# Patient Record
Sex: Female | Born: 1975 | Race: White | Hispanic: No | Marital: Married | Smoking: Former smoker
Health system: Southern US, Community
[De-identification: ages and names within clinical notes are randomized; demographics above are authoritative.]

## PROBLEM LIST (undated history)

## (undated) DIAGNOSIS — I10 Essential (primary) hypertension: Secondary | ICD-10-CM

## (undated) DIAGNOSIS — E7800 Pure hypercholesterolemia, unspecified: Secondary | ICD-10-CM

## (undated) DIAGNOSIS — K859 Acute pancreatitis without necrosis or infection, unspecified: Secondary | ICD-10-CM

## (undated) DIAGNOSIS — F419 Anxiety disorder, unspecified: Secondary | ICD-10-CM

## (undated) DIAGNOSIS — E119 Type 2 diabetes mellitus without complications: Secondary | ICD-10-CM

## (undated) DIAGNOSIS — K219 Gastro-esophageal reflux disease without esophagitis: Secondary | ICD-10-CM

## (undated) HISTORY — DX: Anxiety disorder, unspecified: F41.9

## (undated) HISTORY — DX: Acute pancreatitis without necrosis or infection, unspecified: K85.90

## (undated) HISTORY — DX: Gastro-esophageal reflux disease without esophagitis: K21.9

## (undated) HISTORY — PX: CHOLECYSTECTOMY: SHX55

## (undated) HISTORY — DX: Type 2 diabetes mellitus without complications: E11.9

## (undated) HISTORY — DX: Essential (primary) hypertension: I10

---

## 2001-01-23 ENCOUNTER — Inpatient Hospital Stay (HOSPITAL_COMMUNITY): Admission: AD | Admit: 2001-01-23 | Discharge: 2001-01-25 | Payer: Self-pay | Admitting: Obstetrics and Gynecology

## 2009-05-31 ENCOUNTER — Observation Stay (HOSPITAL_COMMUNITY): Admission: RE | Admit: 2009-05-31 | Discharge: 2009-05-31 | Payer: Self-pay | Admitting: Obstetrics & Gynecology

## 2009-06-11 ENCOUNTER — Inpatient Hospital Stay (HOSPITAL_COMMUNITY): Admission: AD | Admit: 2009-06-11 | Discharge: 2009-06-13 | Payer: Self-pay | Admitting: Obstetrics and Gynecology

## 2010-08-18 ENCOUNTER — Inpatient Hospital Stay (HOSPITAL_COMMUNITY): Admission: AD | Admit: 2010-08-18 | Discharge: 2010-08-18 | Payer: Self-pay | Admitting: Obstetrics and Gynecology

## 2010-08-20 ENCOUNTER — Inpatient Hospital Stay (HOSPITAL_COMMUNITY): Admission: AD | Admit: 2010-08-20 | Discharge: 2010-08-23 | Payer: Self-pay | Admitting: Obstetrics and Gynecology

## 2010-12-16 ENCOUNTER — Encounter: Payer: Self-pay | Admitting: Obstetrics and Gynecology

## 2011-02-04 ENCOUNTER — Observation Stay (HOSPITAL_COMMUNITY)
Admission: EM | Admit: 2011-02-04 | Discharge: 2011-02-04 | Disposition: A | Discharge status: Home / Self Care | Payer: Self-pay | Attending: Emergency Medicine | Admitting: Emergency Medicine

## 2011-02-04 DIAGNOSIS — L519 Erythema multiforme, unspecified: Principal | ICD-10-CM | POA: Insufficient documentation

## 2011-02-04 LAB — RPR: RPR Ser Ql: NONREACTIVE

## 2011-02-04 LAB — CBC
HCT: 43.2 % (ref 36.0–46.0)
MCH: 29.2 pg (ref 26.0–34.0)
MCHC: 35 g/dL (ref 30.0–36.0)
MCV: 83.4 fL (ref 78.0–100.0)
RBC: 5.18 MIL/uL — ABNORMAL HIGH (ref 3.87–5.11)

## 2011-02-04 LAB — DIFFERENTIAL
Basophils Absolute: 0 10*3/uL (ref 0.0–0.1)
Lymphocytes Relative: 11 % — ABNORMAL LOW (ref 12–46)
Neutrophils Relative %: 80 % — ABNORMAL HIGH (ref 43–77)

## 2011-02-04 LAB — BASIC METABOLIC PANEL
BUN: 8 mg/dL (ref 6–23)
CO2: 22 mEq/L (ref 19–32)
Creatinine, Ser: 0.7 mg/dL (ref 0.4–1.2)
GFR calc Af Amer: >60 mL/min (ref >60)
Sodium: 139 mEq/L (ref 135–145)

## 2011-02-05 LAB — POCT I-STAT, CHEM 8
Creatinine, Ser: 0.7 mg/dL (ref 0.4–1.2)
Glucose, Bld: 124 mg/dL — ABNORMAL HIGH (ref 70–99)
Potassium: 7.6 mEq/L (ref 3.5–5.1)
TCO2: 18 mmol/L (ref 0–100)

## 2011-02-07 LAB — CBC
HCT: 34.9 % — ABNORMAL LOW (ref 36.0–46.0)
Hemoglobin: 12.5 g/dL (ref 12.0–15.0)
MCH: 29.6 pg (ref 26.0–34.0)
MCHC: 34.4 g/dL (ref 30.0–36.0)
MCV: 87.3 fL (ref 78.0–100.0)
Platelets: 234 10*3/uL (ref 150–400)
RBC: 4 MIL/uL (ref 3.87–5.11)
RDW: 14.4 % (ref 11.5–15.5)
RDW: 14.5 % (ref 11.5–15.5)
WBC: 12.5 10*3/uL — ABNORMAL HIGH (ref 4.0–10.5)

## 2011-02-07 LAB — COMPREHENSIVE METABOLIC PANEL
ALT: 11 U/L (ref 0–35)
AST: 12 U/L (ref 0–37)
Albumin: 2.6 g/dL — ABNORMAL LOW (ref 3.5–5.2)
CO2: 20 mEq/L (ref 19–32)
Calcium: 9.4 mg/dL (ref 8.4–10.5)
Creatinine, Ser: 0.6 mg/dL (ref 0.4–1.2)
GFR calc Af Amer: >60 mL/min (ref >60)
GFR calc non Af Amer: >60 mL/min (ref >60)
Sodium: 135 mEq/L (ref 135–145)
Total Protein: 5.3 g/dL — ABNORMAL LOW (ref 6.0–8.3)

## 2011-02-07 LAB — URIC ACID: Uric Acid, Serum: 4.3 mg/dL (ref 2.4–7.0)

## 2011-03-03 LAB — CBC
HCT: 31.8 % — ABNORMAL LOW (ref 36.0–46.0)
HCT: 35.9 % — ABNORMAL LOW (ref 36.0–46.0)
Hemoglobin: 12.3 g/dL (ref 12.0–15.0)
MCHC: 32.9 g/dL (ref 30.0–36.0)
MCHC: 34.2 g/dL (ref 30.0–36.0)
MCV: 87.4 fL (ref 78.0–100.0)
Platelets: 186 10*3/uL (ref 150–400)
RBC: 3.64 MIL/uL — ABNORMAL LOW (ref 3.87–5.11)
RBC: 4.23 MIL/uL (ref 3.87–5.11)
WBC: 10 10*3/uL (ref 4.0–10.5)

## 2011-04-09 NOTE — H&P (Signed)
Amy, Osborne             ACCOUNT NO.:  [id number]   MEDICAL RECORD NO.:  [id number]          PATIENT TYPE:  INP   LOCATION:  9142                          FACILITY:  [facility]   PHYSICIAN:  Kendra H. Tenny Craw, MD     DATE OF BIRTH:  1976-03-06   DATE OF ADMISSION:  06/11/2009  DATE OF DISCHARGE:                              HISTORY & PHYSICAL   CHIEF COMPLAINT:  Contractions.   HISTORY OF PRESENT ILLNESS:  [name] is a 35 year old gravida 3,  para 2-0-0-2 presents at 40 weeks and 6 days estimated gestational age  complaining of contractions and was found to be in labor.  At maternity  admission, she was 3 cm dilated, 80% effaced and minus 2 station.  She  has had an uncomplicated prenatal course up to this point and ultrasound  for estimated fetal weight was performed on the Thursday prior to  admission which demonstrate an estimated fetal weight of approximately 7  pounds.   PAST MEDICAL HISTORY:  Obesity with a BMI of 42.   PAST SURGICAL HISTORY:  None.   PAST OBSTETRICAL HISTORY:  Gravida 3, para 2-0-0-2.  In 2000, she had a  spontaneous vaginal delivery of a 8 pounds baby at 38 weeks.  In 2002  she had a spontaneous vaginal delivery at [redacted] weeks gestational age of a  female infant weighing 7 pounds 12 ounces.   PAST GYN HISTORY:  No abnormal Pap smears.  No sexually transmitted  diseases.   SOCIAL HISTORY:  Negative x3.   FAMILY HISTORY:  Noncontributory.   PRENATAL LABS:  Blood type A positive, Rh antibody screen negative, RPR  nonreactive, rubella titer immune, hepatitis B surface antigen negative,  HIV nonreactive.  Pap smear within normal limits.  Gonorrhea and  Chlamydia were negative.  The patient declined genetic screening tests.  One-hour Glucola 88.  Group B strep positive.   PHYSICAL EXAM:  AFVSS  GENERAL: AO x 3, NAD  ABDOMEN: Gravid, obese, soft, non-tender  CERVIX: 3/80/-2  TOCO: Q 2-3 minutes  FHT: 140s reactive   ASSESSMENT AND PLAN:  This is  a 35 year old G3 P2 admitted for labor,  expectant management, penicillin for group B strep prophylaxis and  epidural on request.      [name]. Tenny Craw, MD  Electronically Signed     KHR/MEDQ  D:  06/11/2009  T:  06/11/2009  Job:  [id number]

## 2011-04-12 NOTE — Discharge Summary (Signed)
[facility]  Patient:    ACIRE, TANG                         MRN: [id number] Adm. Date:  [id number] Disc. Date: [id number] Attending:  Osborn Coho                           Discharge Summary  DISCHARGE DIAGNOSES:          1. Term pregnancy delivered 7 pound 8 ounce                                  female infant Apgars 8 and 9.                               2. Blood type A+.                               3. Scheduled induction.  PROCEDURE:                    Vacuum extraction assisted delivery.  SUMMARY:                      This 35 year old gravida 2, para 1 at 40 weeks and 5 days was scheduled for induction.  At the time of admission her cervix was 1 cm dilated, very thick, and vertex was very high.  Amniotomy could not be carried out at the time of admission.  Pitocin was begun and as the patient progressed in labor and her cervix began to open and the vertex began to ascend amniotomy was possible, was carried out with production of clear fluid and an intrauterine pressure catheter was placed.  Pitocin was continued.  The patient progressed to full dilatation and was pushing.  Had some significant fetal bradycardia.  At the time of being called to the room to evaluate this the patient was fully dilated with the vertex at a -1/0 station and the fetal heart rate was in the 90s.  Oxygen and position change brought relief to the fetal heart rate, raising it to the level of 110s.  With encouraged pushing the patient descended the vertex to +2/+3 station where it stalled.  With the aid of the vacuum extractor a vaginal delivery was assisted.  With one contraction the vertex became descended to crowning.  The vacuum extractor popped off at this time and the vertex rotated and the patient had the delivery of a viable 7 pound 8 ounce female infant with Apgars 8 and 9 over intact perineum.  There was no nuchal cord, no maternal tears, and  essentially no real explanation of the fetal bradycardia.  The baby did have a small caput from the vacuum extractor which was noted to be appropriately applied.  Both mother and baby tolerated the delivery process well.                                Mother was bottle feeding.  On the morning of March 2 she was feeling well and was desired for discharge.  Discharge hemoglobin was 11.1 with a white count of 12,200 and a platelet count  of 215,000.  On the morning of March ______ she was desired for discharge and was accordingly was given all discharge instructions.  She was ambulating well, tolerating a regular diet well, having normal bowel and bladder function, and was afebrile.  DISCHARGE MEDICATIONS:        1. Tylox one to two q.4-6h.                               2. Motrin 600 mg q.6h. for less discomfort.                               3. Vitamins one q.d.  FOLLOW-UP:                    She will return to the office in approximately four weeks time or as needed.  CONDITION ON DISCHARGE:       Improved. DD:  02/18/01 TD:  02/18/01 Job: [id number]

## 2016-01-17 ENCOUNTER — Telehealth: Payer: Self-pay | Admitting: Family

## 2016-01-17 ENCOUNTER — Other Ambulatory Visit: Payer: Self-pay | Admitting: Family

## 2016-01-17 DIAGNOSIS — N3000 Acute cystitis without hematuria: Secondary | ICD-10-CM

## 2016-01-17 MED ORDER — SULFAMETHOXAZOLE-TRIMETHOPRIM 800-160 MG PO TABS: 1.0000 | ORAL_TABLET | Freq: Two times a day (BID) | ORAL | Status: DC

## 2016-01-17 NOTE — Progress Notes (Signed)

## 2016-01-17 NOTE — Addendum Note (Signed)
Addended by: Jannifer Rodney A on: 01/17/2016 07:16 PM   Modules accepted: Orders

## 2016-04-12 ENCOUNTER — Ambulatory Visit (INDEPENDENT_AMBULATORY_CARE_PROVIDER_SITE_OTHER): Payer: 59 | Admitting: Internal Medicine

## 2016-04-12 ENCOUNTER — Encounter: Payer: Self-pay | Admitting: Internal Medicine

## 2016-04-12 ENCOUNTER — Telehealth: Payer: Self-pay | Admitting: Internal Medicine

## 2016-04-12 VITALS — BP 138/92 | HR 69 | Temp 98.0°F | Ht 66.0 in | Wt 285.1 lb

## 2016-04-12 DIAGNOSIS — Z7189 Other specified counseling: Secondary | ICD-10-CM | POA: Diagnosis not present

## 2016-04-12 DIAGNOSIS — I10 Essential (primary) hypertension: Secondary | ICD-10-CM

## 2016-04-12 DIAGNOSIS — E669 Obesity, unspecified: Secondary | ICD-10-CM

## 2016-04-12 DIAGNOSIS — Z7689 Persons encountering health services in other specified circumstances: Secondary | ICD-10-CM

## 2016-04-12 DIAGNOSIS — Z114 Encounter for screening for human immunodeficiency virus [HIV]: Secondary | ICD-10-CM

## 2016-04-12 DIAGNOSIS — R002 Palpitations: Secondary | ICD-10-CM

## 2016-04-12 DIAGNOSIS — Z0000 Encounter for general adult medical examination without abnormal findings: Secondary | ICD-10-CM

## 2016-04-12 DIAGNOSIS — Z1159 Encounter for screening for other viral diseases: Secondary | ICD-10-CM | POA: Diagnosis not present

## 2016-04-12 DIAGNOSIS — F411 Generalized anxiety disorder: Secondary | ICD-10-CM

## 2016-04-12 LAB — POCT URINALYSIS DIPSTICK
BILIRUBIN UA: NEGATIVE
GLUCOSE UA: NEGATIVE
KETONES UA: NEGATIVE
LEUKOCYTES UA: NEGATIVE
NITRITE UA: NEGATIVE
PH UA: 7
Protein, UA: NEGATIVE
Spec Grav, UA: 1.02
UROBILINOGEN UA: 0.2

## 2016-04-12 LAB — BASIC METABOLIC PANEL WITH GFR
BUN: 13 mg/dL (ref 7–25)
CALCIUM: 9.1 mg/dL (ref 8.6–10.2)
CHLORIDE: 106 mmol/L (ref 98–110)
CO2: 23 mmol/L (ref 20–31)
Creat: 0.76 mg/dL (ref 0.50–1.10)
GFR, Est African American: >89 mL/min (ref >=60)
GLUCOSE: 100 mg/dL — AB (ref 65–99)
Potassium: 4.3 mmol/L (ref 3.5–5.3)
SODIUM: 139 mmol/L (ref 135–146)

## 2016-04-12 LAB — LIPID PANEL
CHOL/HDL RATIO: 4.3 ratio (ref <=5.0)
CHOLESTEROL: 160 mg/dL (ref 125–200)
HDL: 37 mg/dL — AB (ref >=46)
LDL Cholesterol: 87 mg/dL (ref <130)
Triglycerides: 180 mg/dL — ABNORMAL HIGH (ref <150)
VLDL: 36 mg/dL — ABNORMAL HIGH (ref <30)

## 2016-04-12 LAB — CBC
HEMATOCRIT: 43.3 % (ref 35.0–45.0)
HEMOGLOBIN: 14.1 g/dL (ref 11.7–15.5)
MCH: 27.5 pg (ref 27.0–33.0)
MCHC: 32.6 g/dL (ref 32.0–36.0)
MCV: 84.6 fL (ref 80.0–100.0)
MPV: 10.5 fL (ref 7.5–12.5)
Platelets: 336 10*3/uL (ref 140–400)
RBC: 5.12 MIL/uL — AB (ref 3.80–5.10)
RDW: 14 % (ref 11.0–15.0)
WBC: 8.8 10*3/uL (ref 3.8–10.8)

## 2016-04-12 LAB — POCT UA - MICROSCOPIC ONLY

## 2016-04-12 LAB — HIV ANTIBODY (ROUTINE TESTING W REFLEX): HIV: NONREACTIVE

## 2016-04-12 LAB — TSH: TSH: 1.43 m[IU]/L

## 2016-04-12 LAB — POCT GLYCOSYLATED HEMOGLOBIN (HGB A1C): Hemoglobin A1C: 5.9

## 2016-04-12 MED ORDER — LISINOPRIL 10 MG PO TABS: 10.0000 mg | ORAL_TABLET | Freq: Every day | ORAL | Status: DC

## 2016-04-12 MED FILL — LISINOPRIL 10 MG TABLET: 10 | 30 days supply | Qty: 30 | Fill #0

## 2016-04-12 NOTE — Patient Instructions (Addendum)
Ms. Mal AmabileBrock,  It was nice to meet you today.   I have prescribed lisinopril to help with high blood pressure. If you develop a cough, stop the medication and let us know. You would also want to stop this medication if you become pregnant, as it can affect kidney development.  Please come back to clinic in 1-2 weeks to follow-up blood pressure.   I will call with lab results, which also will be available on My Chart.  Best, Dr. Sampson GoonFitzgerald

## 2016-04-12 NOTE — Progress Notes (Signed)
Subjective:    Patient ID: Amy Osborne, female    DOB: 1976-01-24, 40 y.o.   MRN: [id number]  HPI  Amy Osborne is a 40-y/o female who presents to establish care. Goals include managing anxiety and weight.   PMH: - Was uninsured for the past 5-6 years and has had limited medical care over that period of time.  - Had 2 visits to urgent care over the last several months where she reports having systolic blood pressure readings of 180 and 165. - Feels that she has struggled with anxiety and depression in her adult years - Has "random" sensation of palpitations. Could not say how often she experiences this but says it's "more than once in a while" and happens at rest. Also has had hair shedding recently. Has decreased her caffeine intake to virtually none and still occurs.  - Notes chronic aching in her hand and feet that is worse with rainy and cold weather. Describes as a "deep bone pain."  Medications: - Takes ibuprofen 600 mg up to 3 times daily for aches and pains  Allergies: - No known allergies  Surgical History: - Gall bladder removed > 7 years ago  Family History: - Mother: rheumatoid arthritis, anxiety/depression (takes lexapro) - Father: melanoma, T2DM, HLD, HTN - Sister: anxiety/depression, nodules of thyroid (being worked up) - Grandparents: Alzheimer's (m. Actor); heart disease, T2DM, HLD, HTN (p. Grandmother), stroke (m. Surveyor, minerals)  Social History: - Recently began working as a Nurse, learning disability at [employer]. - Has four daughters (17, 45, 6 and 5), the youngest of whom is autistic - Remarried to husband Amy Osborne (44) and had a difficult prior divorce - Used to smoke cigarettes for about 10 years and quit in 2005 - Does not exercise regularly - Does not drink alcohol or use recreational drugs  Health Care Maintenance: - Reports she had a tetanus shot around 2012 and pap smear around 2010.   Review of Systems  Constitutional: Negative for fever and chills.    HENT: Negative for congestion and rhinorrhea.   Eyes: Negative for pain and visual disturbance.  Respiratory: Negative for cough and chest tightness.   Cardiovascular: Positive for palpitations. Negative for chest pain.  Gastrointestinal: Negative for nausea and constipation.  Endocrine: Negative for polydipsia and polyuria.  Genitourinary: Negative for frequency and menstrual problem.  Musculoskeletal: Positive for arthralgias. Negative for joint swelling.  Skin: Negative for rash.  Neurological: Negative for dizziness and weakness.  Psychiatric/Behavioral: Positive for sleep disturbance. The patient is nervous/anxious.       Objective: Blood pressure 138/92, pulse 69, temperature 98 F (36.7 C), temperature source Oral, height  (1.676 m), weight 285 lb 1.6 oz (129.321 kg). Initial BP was 155/96.   Physical Exam  Constitutional: She is oriented to person, place, and time.  Very pleasant, obese female, in NAD  HENT:  Head: Normocephalic and atraumatic.  Nose: Nose normal.  Mouth/Throat: Oropharynx is clear and moist.  Eyes: Conjunctivae are normal. Pupils are equal, round, and reactive to light.  Neck: Normal range of motion. Neck supple. No thyromegaly present.  Cardiovascular: Normal rate, regular rhythm and normal heart sounds.   No murmur heard. Pulmonary/Chest: Effort normal and breath sounds normal. No respiratory distress. She has no wheezes.  Abdominal: Soft. Bowel sounds are normal. She exhibits no distension. There is no tenderness. There is no rebound and no guarding.  Musculoskeletal: Normal range of motion. She exhibits no edema.  Lymphadenopathy:    She has no cervical adenopathy.  Neurological: She is alert and oriented to person, place, and time.  Skin: Skin is warm and dry.  Psychiatric:  Mood normal. Somewhat anxious affect.   GAD-7: 14 PHQ-2: 0    Assessment & Plan:  Patient presents to establish care. Goals of care include treating high blood  pressure, working on weight loss, and addressing anxiety.  Patient to follow-up in 1-2 weeks about blood pressure and labs.  Essential hypertension - Prescribed lisinopril 10 mg. - Obtain BMP and UA.  - Decreasing NSAID use as able may also help.   Generalized anxiety disorder - Continue to discuss at future visits. - Will screen for thyroid hormone abnormality with TSH.  Obesity - Obtain lipid panel and hgb A1c.  Health care maintenance - Obtain HIV screen.  - Due for pap smear. Will need to obtain records of TDAP.   Dani GobbleHillary Fitzgerald, MD [facility], PGY-1

## 2016-04-12 NOTE — Telephone Encounter (Signed)
Called patient to inform her of Hgb A1c result that showed pre-diabetes. She is very motivated to make lifestyle changes and is also interested in meeting with a nutritionist. Will help her arrange at follow-up.

## 2016-04-14 ENCOUNTER — Encounter: Payer: Self-pay | Admitting: Internal Medicine

## 2016-04-14 DIAGNOSIS — I10 Essential (primary) hypertension: Secondary | ICD-10-CM | POA: Insufficient documentation

## 2016-04-14 DIAGNOSIS — Z0000 Encounter for general adult medical examination without abnormal findings: Secondary | ICD-10-CM | POA: Insufficient documentation

## 2016-04-14 DIAGNOSIS — E669 Obesity, unspecified: Secondary | ICD-10-CM | POA: Insufficient documentation

## 2016-04-14 DIAGNOSIS — F411 Generalized anxiety disorder: Secondary | ICD-10-CM | POA: Insufficient documentation

## 2016-04-14 NOTE — Assessment & Plan Note (Signed)
-   Continue to discuss at future visits. - Will screen for thyroid hormone abnormality with TSH.

## 2016-04-14 NOTE — Assessment & Plan Note (Signed)
-   Obtain HIV screen.  - Due for pap smear. Will need to obtain records of TDAP.

## 2016-04-14 NOTE — Assessment & Plan Note (Signed)
-   Obtain lipid panel and hgb A1c.

## 2016-04-14 NOTE — Assessment & Plan Note (Addendum)
-   Prescribed lisinopril 10 mg. - Obtain BMP and UA.  - Decreasing NSAID use as able may also help.

## 2016-04-15 ENCOUNTER — Telehealth: Payer: Self-pay | Admitting: Internal Medicine

## 2016-04-15 NOTE — Telephone Encounter (Signed)
Pt called because she was prescribed Lisinopril. When she took her second dose she had severe stomach pains that last until today. He second dose was on Saturday. Please call to discuss with the nurse on what should she do. jw

## 2016-04-16 ENCOUNTER — Telehealth: Payer: Self-pay | Admitting: Family Medicine

## 2016-04-16 ENCOUNTER — Encounter: Payer: Self-pay | Admitting: Family Medicine

## 2016-04-16 ENCOUNTER — Ambulatory Visit (INDEPENDENT_AMBULATORY_CARE_PROVIDER_SITE_OTHER): Payer: 59 | Admitting: Family Medicine

## 2016-04-16 VITALS — BP 160/103 | HR 85 | Temp 97.9°F | Ht 66.0 in | Wt 281.4 lb

## 2016-04-16 DIAGNOSIS — I10 Essential (primary) hypertension: Secondary | ICD-10-CM

## 2016-04-16 DIAGNOSIS — R1012 Left upper quadrant pain: Secondary | ICD-10-CM

## 2016-04-16 DIAGNOSIS — R109 Unspecified abdominal pain: Secondary | ICD-10-CM | POA: Diagnosis not present

## 2016-04-16 DIAGNOSIS — K853 Drug induced acute pancreatitis without necrosis or infection: Secondary | ICD-10-CM | POA: Insufficient documentation

## 2016-04-16 LAB — COMPLETE METABOLIC PANEL WITH GFR
ALBUMIN: 4.2 g/dL (ref 3.6–5.1)
ALK PHOS: 97 U/L (ref 33–115)
ALT: 42 U/L — ABNORMAL HIGH (ref 6–29)
AST: 28 U/L (ref 10–30)
BUN: 11 mg/dL (ref 7–25)
CALCIUM: 9.4 mg/dL (ref 8.6–10.2)
CHLORIDE: 104 mmol/L (ref 98–110)
CO2: 24 mmol/L (ref 20–31)
Creat: 0.68 mg/dL (ref 0.50–1.10)
GFR, Est African American: >89 mL/min (ref >=60)
Glucose, Bld: 113 mg/dL — ABNORMAL HIGH (ref 65–99)
POTASSIUM: 4.3 mmol/L (ref 3.5–5.3)
Sodium: 137 mmol/L (ref 135–146)
Total Bilirubin: 0.6 mg/dL (ref 0.2–1.2)
Total Protein: 6.5 g/dL (ref 6.1–8.1)

## 2016-04-16 LAB — CBC WITH DIFFERENTIAL/PLATELET
BASOS PCT: 0 %
Basophils Absolute: 0 cells/uL (ref 0–200)
EOS PCT: 2 %
Eosinophils Absolute: 158 cells/uL (ref 15–500)
HCT: 43.5 % (ref 35.0–45.0)
HEMOGLOBIN: 14.4 g/dL (ref 11.7–15.5)
LYMPHS ABS: 2133 {cells}/uL (ref 850–3900)
Lymphocytes Relative: 27 %
MCH: 28 pg (ref 27.0–33.0)
MCHC: 33.1 g/dL (ref 32.0–36.0)
MCV: 84.6 fL (ref 80.0–100.0)
MONOS PCT: 6 %
MPV: 10.5 fL (ref 7.5–12.5)
Monocytes Absolute: 474 cells/uL (ref 200–950)
NEUTROS ABS: 5135 {cells}/uL (ref 1500–7800)
Neutrophils Relative %: 65 %
Platelets: 306 10*3/uL (ref 140–400)
RBC: 5.14 MIL/uL — AB (ref 3.80–5.10)
RDW: 13.8 % (ref 11.0–15.0)
WBC: 7.9 10*3/uL (ref 3.8–10.8)

## 2016-04-16 LAB — LIPASE: LIPASE: 640 U/L — AB (ref 7–60)

## 2016-04-16 LAB — POCT UA - MICROSCOPIC ONLY

## 2016-04-16 LAB — POCT URINALYSIS DIPSTICK
BILIRUBIN UA: NEGATIVE
GLUCOSE UA: NEGATIVE
Ketones, UA: NEGATIVE
NITRITE UA: NEGATIVE
PH UA: 7
Protein, UA: NEGATIVE
RBC UA: NEGATIVE
Spec Grav, UA: 1.015
UROBILINOGEN UA: 0.2

## 2016-04-16 LAB — POCT URINE PREGNANCY: Preg Test, Ur: NEGATIVE

## 2016-04-16 MED ORDER — PANTOPRAZOLE SODIUM 40 MG PO TBEC: 40.0000 mg | DELAYED_RELEASE_TABLET | Freq: Every day | ORAL | Status: DC

## 2016-04-16 MED ORDER — RANITIDINE HCL 150 MG PO TABS: 150.0000 mg | ORAL_TABLET | Freq: Two times a day (BID) | ORAL | Status: DC

## 2016-04-16 MED FILL — PANTOPRAZOLE SOD DR 40 MG T: 40 | 30 days supply | Qty: 30 | Fill #0

## 2016-04-16 MED FILL — raNITIdine HCL 150 MG TABS: 150 | 30 days supply | Qty: 60 | Fill #0

## 2016-04-16 NOTE — Assessment & Plan Note (Signed)
Blood pressure up today, though not on lisinopril at present due to LUQ pain. Advised it is ok if she holds off on taking lisinopril for now. We will see her back in 1 week and address blood pressure at that time.

## 2016-04-16 NOTE — Assessment & Plan Note (Addendum)
Acute in onset four days ago. Differential diagnosis includes ulcer, gastritis, nephrolithiasis, pancreatitis, among other potential etiologies. Patient is systemically well appearing, afebrile, and has an overall benign abdominal exam today (just focal tenderness in LUQ). Leading etiology at this time is gastritis with possible peptic ulcer disease given known frequent ingestion of moderately high doses of NSAIDs (600mg  three times daily). Urinalysis without blood or other major signs of infection so doubt pyelonephritis or nephrolithiasis at this time. Urine pregnancy test negative. Splenic pathology less likely as no spleen palpable. No red flags (no fever, blood in stool, vomiting, etc). Doubt this is related to lisinopril, suspect timing is coincidental. Discussed options for evaluation & treatment with patient, including labs, imaging, medication trial. Patient would prefer to try less expensive workup/treatment first. After discussion, plan we settled on is: - labs today: CMET, CBC with diff, lipase, urea breath test for H pylori - scheduled zantac and protonix - follow up in 1 week to eval for improvement, sooner if not improving or worsening - if worsens or not improving would consider CT abdomen & GI referral for likely EGD - back off ibuprofen - patient agreeable to this plan

## 2016-04-16 NOTE — Progress Notes (Signed)
Date of Visit: 04/16/2016   HPI:  Patient presents for a same day appointment to discuss pain in LUQ.  Patient was just seen here last week to establish care with Dr. Sampson Goon. She was started on lisinopril  daily for hypertension. Took the first pill on Friday. Saturday took the second pill, and within a few hours began having pain in LUQ radiating around to her back. The pain steadily increased. Was restless all night on Saturday, couldn't find a comfortable position. Took her husband's tizanidine to be able to sleep. Pain has persisted ever since then. Intensity comes and goes but it is always present. Highest pain scale is 8, right now it is 4-5. Patient reports high pain tolerance, has had unmedicated vaginal deliveries in the past.  Denies vomiting but has had some mild nausea. No diarrhea but had a little bit of loose stool.  Does not think she's had a temperature though husband told her she felt warm the other night. No dysuria or blood in urine or stool. Has decreased appetite but is drinking normally. Unknown LMP as she has a mirena IUD. No history of pain like this in the past. Has not taken any lisinopril since Saturday. Tried tums, without significant relief.  Of note, has been taking a lot of ibuprofen lately due to pain in her hands. Takes ibuprofen  in the morning, 600 at lunch, and  at evening on the days she goes to work. 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  Prior abdominal surgeries include cholecystectomy.  ROS: See HPI  PMFSH: history of hypertension, GAD, obesity  PHYSICAL EXAM: BP 160/103 mmHg  Pulse 85  Temp(Src) 97.9 F (36.6 C) (Oral)  Ht  (1.676 m)  Wt 281 lb 6.4 oz (127.642 kg)  BMI 45.44 kg/m2 Gen: NAD, pleasant, cooperative, well appearing HEENT: normocephalic, atraumatic, moist mucous membranes  Heart: regular rate and rhythm, no murmur Lungs: clear to auscultation bilaterally, normal work of  breathing  Abdomen: obese. Soft. Tender to palpation in LUQ area. No peritoneal signs or rigidity. No masses or organomegaly Neuro: alert, grossly nonfocal, speech normal Extremities: No appreciable lower extremity edema bilaterally  Skin: no rashes over left abdomen, flank, or back  ASSESSMENT/PLAN:  Abdominal pain, left upper quadrant Acute in onset four days ago. Differential diagnosis includes ulcer, gastritis, nephrolithiasis, pancreatitis, among other potential etiologies. Patient is systemically well appearing, afebrile, and has an overall benign abdominal exam today (just focal tenderness in LUQ). Leading etiology at this time is gastritis with possible peptic ulcer disease given known frequent ingestion of moderately high doses of NSAIDs (  three times daily). Urinalysis without blood or other major signs of infection so doubt pyelonephritis or nephrolithiasis at this time. Urine pregnancy test negative. Splenic pathology less likely as no spleen palpable. No red flags (no fever, blood in stool, vomiting, etc). Doubt this is related to lisinopril, suspect timing is coincidental. Discussed options for evaluation & treatment with patient, including labs, imaging, medication trial. Patient would prefer to try less expensive workup/treatment first. After discussion, plan we settled on is: - labs today: CMET, CBC with diff, lipase, urea breath test for H pylori - scheduled zantac and protonix - follow up in 1 week to eval for improvement, sooner if not improving or worsening - if worsens or not improving would consider CT abdomen & GI referral for likely EGD - back off ibuprofen - patient agreeable to this plan  Essential hypertension  Blood pressure up today, though not on lisinopril at present due to LUQ pain. Advised it is ok if she holds off on taking lisinopril for now. We will see her back in 1 week and address blood pressure at that time.    FOLLOW UP: Follow up in 1 week for LUQ  pain  GrenadaBrittany J. Pollie MeyerMcIntyre, MD [facility]

## 2016-04-16 NOTE — Telephone Encounter (Signed)
Called patient to reiterate to her that she should STOP ibuprofen for now - not sure I told her this adamantly or clearly in her visit earlier.  She reports she took zantac and protonix as soon as she could after the visit earlier today. Has not had any relief. Pain is now at a 6-7. Not eating or drinking much now as she feels worse after doing that. Has tolerated some PO but is overall less than normal. She has been burping a lot this afternoon.  Reviewed cardiac systems with her - denies chest pain or shortness of breath. Over the last few weeks has had very brief episodes of palpitations, lasting 10-15 seconds at a time, then goes away. That is not typical for her.  Advised I will call her tomorrow to let her know what labs show (none are back yet right now). We will go from there to decide on next steps. Advised going to ED tonight if pain worsens or she is not able to tolerate PO at all, has decreased urination, etc.   Patient appreciative.  Latrelle DodrillBrittany J Mckale Haffey, MD

## 2016-04-16 NOTE — Patient Instructions (Signed)
Checking labs today: liver, kidneys, blood counts, pancreas Also checking breath test for H Pylori  Try backing off of the ibuprofen  Start zantac 150mg  twice daily  Also protonix 40mg  daily  Sent both of these in to your pharmacy  Follow up in 1 week, sooner if not getting better If not better we can send to GI or get CT scan  Be well, Dr. Pollie MeyerMcIntyre

## 2016-04-16 NOTE — Telephone Encounter (Signed)
It looks like she was seen for SDA with Dr. Pollie MeyerMcIntyre. Hopefully this issue with the lisinopril was discussed at this appointment. If patient needs further advice please let me know.   Marcy Sirenatherine Wallace, D.O. 04/16/2016, 1:34 PM PGY-1, [facility]

## 2016-04-17 ENCOUNTER — Telehealth: Payer: Self-pay | Admitting: Family Medicine

## 2016-04-17 LAB — H. PYLORI BREATH TEST: H. PYLORI BREATH TEST: NOT DETECTED

## 2016-04-17 MED ORDER — HYDROCODONE-ACETAMINOPHEN 5-325 MG PO TABS: 1.0000 | ORAL_TABLET | Freq: Four times a day (QID) | ORAL | Status: DC | PRN

## 2016-04-17 MED FILL — HYDROCODON-APAP 5-325: 5-325 | 8 days supply | Qty: 30 | Fill #0

## 2016-04-17 NOTE — Telephone Encounter (Signed)
Called patient to discuss labs.  Lipase elevated at 640 - suggestive of acute pancreatitis. This may in fact be due to lisinopril (labeling says approximately 1% risk of pancreatitis with this medication).  Rest of labs relatively unremarkable. Mild ALT elevation at 40, fairly nonspecific. No prior LFTs to compare.  Discussed dx with patient and options for management. Explained this is a common reason for hospital admission but that if her pain is manageable at home and she is able to stay hydrated with liquids, that we can attempt outpatient management. She would prefer to avoid admission if possible. 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  Advised small frequent sips of clear liquids. She will monitor her hydration status by ensuring she continues to urinate normally. Recommend that she can hold off on eating unless she feels hungry - in which case can try bland foods. She has just taken her first sip of liquids of the morning. Has not eaten yet since last night.  Stressed importance of going to ER if any worsening, having a very low threshold for admission should her pain worsen, she is unable to maintain hydration, begins vomiting, has a fever, etc. Also offered that if pain is not manageable she can call us and we can try to arrange direct admission. Will leave rx for Norco at the front desk for her or her husband to pick up.   Follow up appointment scheduled for Friday morning with me at 8:45am. Patient appreciative.  Latrelle DodrillBrittany J Dreyton Roessner, MD

## 2016-04-18 ENCOUNTER — Encounter: Payer: Self-pay | Admitting: Family Medicine

## 2016-04-19 ENCOUNTER — Telehealth: Payer: Self-pay | Admitting: Family Medicine

## 2016-04-19 ENCOUNTER — Ambulatory Visit (HOSPITAL_COMMUNITY)
Admission: RE | Admit: 2016-04-19 | Discharge: 2016-04-19 | Disposition: A | Discharge status: Home / Self Care | Payer: 59 | Source: Ambulatory Visit | Attending: Family Medicine | Admitting: Family Medicine

## 2016-04-19 ENCOUNTER — Encounter: Payer: Self-pay | Admitting: Family Medicine

## 2016-04-19 ENCOUNTER — Ambulatory Visit (INDEPENDENT_AMBULATORY_CARE_PROVIDER_SITE_OTHER): Payer: 59 | Admitting: Family Medicine

## 2016-04-19 VITALS — BP 170/97 | HR 82 | Temp 97.9°F | Wt 275.0 lb

## 2016-04-19 DIAGNOSIS — Z9049 Acquired absence of other specified parts of digestive tract: Secondary | ICD-10-CM | POA: Insufficient documentation

## 2016-04-19 DIAGNOSIS — K760 Fatty (change of) liver, not elsewhere classified: Secondary | ICD-10-CM | POA: Insufficient documentation

## 2016-04-19 DIAGNOSIS — K853 Drug induced acute pancreatitis without necrosis or infection: Secondary | ICD-10-CM | POA: Diagnosis not present

## 2016-04-19 DIAGNOSIS — K858 Other acute pancreatitis without necrosis or infection: Secondary | ICD-10-CM

## 2016-04-19 DIAGNOSIS — K859 Acute pancreatitis without necrosis or infection, unspecified: Secondary | ICD-10-CM | POA: Diagnosis not present

## 2016-04-19 LAB — CBC WITH DIFFERENTIAL/PLATELET
BASOS ABS: 0 10*3/uL (ref 0.0–0.1)
Basophils Relative: 0 %
EOS ABS: 0.2 10*3/uL (ref 0.0–0.7)
EOS PCT: 2 %
HCT: 44.8 % (ref 36.0–46.0)
Hemoglobin: 14.7 g/dL (ref 12.0–15.0)
LYMPHS PCT: 23 %
Lymphs Abs: 2.1 10*3/uL (ref 0.7–4.0)
MCH: 27.7 pg (ref 26.0–34.0)
MCHC: 32.8 g/dL (ref 30.0–36.0)
MCV: 84.4 fL (ref 78.0–100.0)
Monocytes Absolute: 0.5 10*3/uL (ref 0.1–1.0)
Monocytes Relative: 5 %
Neutro Abs: 6.5 10*3/uL (ref 1.7–7.7)
Neutrophils Relative %: 70 %
PLATELETS: 303 10*3/uL (ref 150–400)
RBC: 5.31 MIL/uL — AB (ref 3.87–5.11)
RDW: 13.2 % (ref 11.5–15.5)
WBC: 9.4 10*3/uL (ref 4.0–10.5)

## 2016-04-19 LAB — COMPREHENSIVE METABOLIC PANEL
ALT: 55 U/L — ABNORMAL HIGH (ref 14–54)
AST: 37 U/L (ref 15–41)
Albumin: 4.5 g/dL (ref 3.5–5.0)
Alkaline Phosphatase: 93 U/L (ref 38–126)
Anion gap: 8 (ref 5–15)
BILIRUBIN TOTAL: 1.1 mg/dL (ref 0.3–1.2)
BUN: 7 mg/dL (ref 6–20)
CO2: 22 mmol/L (ref 22–32)
Calcium: 9.8 mg/dL (ref 8.9–10.3)
Chloride: 106 mmol/L (ref 101–111)
Creatinine, Ser: 0.78 mg/dL (ref 0.44–1.00)
Glucose, Bld: 103 mg/dL — ABNORMAL HIGH (ref 65–99)
POTASSIUM: 4.4 mmol/L (ref 3.5–5.1)
Sodium: 136 mmol/L (ref 135–145)
TOTAL PROTEIN: 6.6 g/dL (ref 6.5–8.1)

## 2016-04-19 LAB — LIPASE, BLOOD: LIPASE: 65 U/L — AB (ref 11–51)

## 2016-04-19 MED ORDER — AMLODIPINE BESYLATE 5 MG PO TABS: 5.0000 mg | ORAL_TABLET | Freq: Every day | ORAL | Status: DC

## 2016-04-19 MED ORDER — HYDROMORPHONE HCL 2 MG PO TABS: 2.0000 mg | ORAL_TABLET | ORAL | Status: DC | PRN

## 2016-04-19 MED ORDER — SODIUM CHLORIDE 0.9 % IV SOLN
INTRAVENOUS | Status: DC
  Administered 2016-04-19: 10:00:00 via INTRAVENOUS

## 2016-04-19 MED ORDER — DIATRIZOATE MEGLUMINE & SODIUM 66-10 % PO SOLN
30.0000 mL | Freq: Once | ORAL | Status: AC
  Administered 2016-04-19: 30 mL via ORAL

## 2016-04-19 MED ORDER — IOPAMIDOL (ISOVUE-300) INJECTION 61%
100.0000 mL | Freq: Once | INTRAVENOUS | Status: AC | PRN
  Administered 2016-04-19: 100 mL via INTRAVENOUS

## 2016-04-19 MED FILL — HYDROmorphone HCL 2 MG TABS: 2 | 2 days supply | Qty: 20 | Fill #0

## 2016-04-19 MED FILL — AMLODIPINE BESYLATE 5 MG TA: 5 | 30 days supply | Qty: 30 | Fill #0

## 2016-04-19 NOTE — Assessment & Plan Note (Signed)
Continues to have pain despite relative bowel rest (minimal solids but enteral liquid ingestion). Vitals remain stable (other than elevated blood pressure) and abdominal exam remains without peritoneal signs or masses. She does appear mildly dehydrated on exam, with tacky mucous membranes. She has not had any imaging of her abdomen, so I think it is reasonable to obtain a CT to rule out complications from pancreatitis or other separate pathology contributing to her pain. Also warrants labs to re-evaluate lipase, electrolytes, renal & hepatic function as well as blood counts. Again discussed options for management, including outpatient CT, fluids, and rechecking labs versus ER visit versus direct admission to the hospital. She continues to prefer trial of outpatient management. Plan: - stat labs today: CMET, lipase, CBC with diff, obtained today during clinic - stat CT abdomen/pelvis with contrast, scheduled for early this afternoon - 1L normal saline given IV during clinic today to enhance hydration status - start amlodipine 5mg  daily today to help lower BP and hopefully improve headache - change pain medication to PO dilaudid for better relief - follow up later today by phone after CT and labs have resulted - patient agreeable to this plan & appreciative

## 2016-04-19 NOTE — Progress Notes (Signed)
Date of Visit: 04/19/2016   HPI:  Patient presents for follow up of pancreatitis, diagnosed earlier this week with elevated lipase in the 600s. Presumed to be due to lisinopril, which has been stopped.  Patient reports she is still having the same pain, located in LUQ and radiating directly through to her back. Using norco for pain relief, without significant relief other than it just making her tired. Sipping liquids. Yesterday had leg cramps and drank some powerade zero to replenish electrolytes. No fevers, vomiting. Has had some diarrhea. Feels hungry but pain is worse with eating. No blood in stool. Urinating well, though less than normal. Urine is clear.  Has had also had headache for a few days. No numbness, tingling weakness, speech changes, confusion, vision changes, neck stiffness. Wonders if headache is due to elevated blood pressure. Is not presently on any blood pressure medications since lisinopril was stopped after 2 doses.  ROS: See HPI.  PMFSH: history of GAD, obesity, hypertension   PHYSICAL EXAM: BP 170/97 mmHg  Pulse 82  Temp(Src) 97.9 F (36.6 C) (Oral)  Wt 275 lb (124.739 kg) Gen: NAD, pleasant, cooperative HEENT: normocephalic, atraumatic, mildly dry mucous membranes. Pupils equal round and reactive to light.  Heart: regular rate and rhythm, no murmur Lungs: clear to auscultation bilaterally, normal work of breathing  Neuro: alert, grossly nonfocal, speech normal Abdomen: obese. Soft, no masses. Moderately tender to palpation in epigastric area and LUQ. Normoactive bowel sounds  Ext: atraumatic. Brisk capillary refill Back: no CVA tenderness bilaterally  ASSESSMENT/PLAN:  Drug-induced acute pancreatitis Continues to have pain despite relative bowel rest (minimal solids but enteral liquid ingestion). Vitals remain stable (other than elevated blood pressure) and abdominal exam remains without peritoneal signs or masses. She does appear mildly dehydrated on exam,  with tacky mucous membranes. She has not had any imaging of her abdomen, so I think it is reasonable to obtain a CT to rule out complications from pancreatitis or other separate pathology contributing to her pain. Also warrants labs to re-evaluate lipase, electrolytes, renal & hepatic function as well as blood counts. Again discussed options for management, including outpatient CT, fluids, and rechecking labs versus ER visit versus direct admission to the hospital. She continues to prefer trial of outpatient management. Plan: - stat labs today: CMET, lipase, CBC with diff, obtained today during clinic - stat CT abdomen/pelvis with contrast, scheduled for early this afternoon - 1L normal saline given IV during clinic today to enhance hydration status - start amlodipine 5mg  daily today to help lower BP and hopefully improve headache - change pain medication to PO dilaudid for better relief - follow up later today by phone after CT and labs have resulted - patient agreeable to this plan & appreciative   FOLLOW UP: Follow up by phone after CT scan today  GrenadaBrittany J. Pollie MeyerMcIntyre, MD [facility]

## 2016-04-19 NOTE — Telephone Encounter (Signed)
Attempted to reach patient to discuss CT & lab results No answer. Will try again to reach her in a bit.  Latrelle DodrillBrittany J Emmet Messer, MD

## 2016-04-19 NOTE — Patient Instructions (Signed)
Getting CT scan today Continue to stay hydrated with plenty of fluids  For pain control - oral dilaudid.  For headache & elevated blood pressure - sent in amlodipine 5mg  daily  I will call you when I have your labs and CT scan back.  Call with any questions  If worsening pain, vomiting, fever, etc please call us so we can arrange for admission to the hospital, or go to the ER.  Be well, Dr. Pollie MeyerMcIntyre

## 2016-04-19 NOTE — Telephone Encounter (Signed)
Spoke with patient & relayed results Overall great news - lipase is down, CT without abnormalities She reports headache is improving  Hopefully she will notice improvement in abdominal pain & PO tolerance over the weekend Again reviewed red flags and that she can call at any time with questions & speak with on call resident team Patient appreciative  Note to on call residents - if patient calls in this weekend and is unable to tolerate PO or having uncontrolled pain, as long as she sounds stable I would try to arrange for direct admission rather than having her go through the ED. Feel free to contact me this weekend if I can be of help.  Latrelle DodrillBrittany J Joshue Badal, MD

## 2016-05-21 MED FILL — AMLODIPINE BESYLATE 5 MG TA: 5 | 30 days supply | Qty: 30 | Fill #1

## 2016-05-29 ENCOUNTER — Encounter: Payer: Self-pay | Admitting: Internal Medicine

## 2016-05-29 ENCOUNTER — Ambulatory Visit (INDEPENDENT_AMBULATORY_CARE_PROVIDER_SITE_OTHER): Payer: 59 | Admitting: Internal Medicine

## 2016-05-29 VITALS — BP 149/95 | HR 68 | Temp 98.1°F | Ht 66.0 in | Wt 282.4 lb

## 2016-05-29 DIAGNOSIS — I10 Essential (primary) hypertension: Secondary | ICD-10-CM

## 2016-05-29 DIAGNOSIS — K853 Drug induced acute pancreatitis without necrosis or infection: Secondary | ICD-10-CM

## 2016-05-29 DIAGNOSIS — E669 Obesity, unspecified: Secondary | ICD-10-CM | POA: Diagnosis not present

## 2016-05-29 MED ORDER — AMLODIPINE BESYLATE 10 MG PO TABS: 10.0000 mg | ORAL_TABLET | Freq: Every day | ORAL | Status: DC

## 2016-05-29 NOTE — Patient Instructions (Signed)
[name],  Thank you for coming in today.  You should get a call from surgery within the next week for referral.  If you experience any shortness of breath or increased swelling of hands or feet with increased dose of amlodipine, please stop the medication and call clinic. We can try another medication if so.  Please make an appointment for mirena removal and/or a nurse visit for blood pressure recheck.  Best, Dr. Sampson GoonFitzgerald

## 2016-05-29 NOTE — Progress Notes (Signed)
[facility] Progress Note  Subjective:  Amy Osborne is a 40-yo female who presents for follow-up after pancreatitis. PMH significant for anxiety, HTN, pre-diabetes, and obesity.  Pancreatitis: - Pt had elevated lipase to 640 on 04/16/16, LUQ pain and nausea after initiating BP treatment with lisinopril - She was able to be managed outpatient with improvement in symptoms; IVFs given in clinic in May for concern for dehydration. CT abdomen pelvis 5/26 did not show inflammation of the pancreas; it did show mild diffuse fatty infiltration of the liver - Currently is tolerating a regular diet - Still gets occasional "twinges" of LUQ pain that may last an hour; rates pain at a 3/4 out of 10 ROS: No n/v/d; no fevers/chills  HTN: - Switched to amlodipine 5 mg after developing pancreatitis on lisinopril - Has had improvement in headaches since starting medication for BP - Denies LE swelling but did have 1 episode of feeling like her hands were tight while at work ROS: occasional headaches (once or twice a week), denies vision changes  Obesity: - Patient expressed interest in being referred for gastric bypass surgery - Her husband had the procedure last October and has had great results, per patient - Reports she has tried a range of different diets without success and believes she needs surgery to reduce sensation of hunger  Social: Former smoker  Objective: Blood pressure 159/91, pulse 68, temperature 98.1 F (36.7 C), temperature source Oral, height 5\' 6"  (1.676 m), weight 282 lb 6.4 oz (128.096 kg). Repeat BP 149/95 Constitutional: Pleasant, obese female, in NAD HENT: MMM Cardiovascular: RRR, S1, S2, no m/r/g.  Pulmonary/Chest: Effort normal and breath sounds normal. No respiratory distress.  Abdominal: Soft. +BS, NT, ND, no rebound or guarding.  Psychiatric: Normal mood and affect.  Vitals reviewed  Assessment/Plan: Drug-induced acute pancreatitis - Resolved. Patient  now tolerating regular diet.  - Repeat LFTs at next visit.   Obesity - Patient has BMI>40 and weight-related complications of HTN and pre-diabetes; will refer to general surgery (Dr. Gaynelle AduEric Osborne) for assessment for gastric bypass  Essential hypertension - Not at goal of <140/90 per JNC-8 guidelines - Will increase amlodipine to 10 mg daily; patient counseled to stop medications and call clinic if she develops peripheral extremity swelling - Could consider metoprolol given patient history of migraines   Follow-up within 2 weeks for BP recheck.  [name], MD [facility], PGY-2

## 2016-05-30 ENCOUNTER — Encounter: Payer: Self-pay | Admitting: Internal Medicine

## 2016-05-30 NOTE — Assessment & Plan Note (Signed)
-   Resolved. Patient now tolerating regular diet.  - Repeat LFTs at next visit.

## 2016-05-30 NOTE — Assessment & Plan Note (Signed)
-   Patient has BMI>40 and weight-related complications of HTN and pre-diabetes; will refer to general surgery (Dr. Gaynelle AduEric Wilson) for assessment for gastric bypass

## 2016-05-30 NOTE — Assessment & Plan Note (Signed)
-   Not at goal of <140/90 per JNC-8 guidelines - Will increase amlodipine to 10 mg daily; patient counseled to stop medications and call clinic if she develops peripheral extremity swelling - Could consider metoprolol given patient history of migraines

## 2016-06-12 ENCOUNTER — Encounter: Payer: Self-pay | Admitting: Internal Medicine

## 2016-06-12 DIAGNOSIS — I10 Essential (primary) hypertension: Secondary | ICD-10-CM

## 2016-06-13 MED ORDER — METOPROLOL TARTRATE 50 MG PO TABS: 50.0000 mg | ORAL_TABLET | Freq: Two times a day (BID) | ORAL | Status: DC

## 2016-06-13 MED FILL — METOPROLOL TARTRATE 50 MG T: 50 | 30 days supply | Qty: 60 | Fill #0

## 2016-06-13 NOTE — Telephone Encounter (Signed)
Prescribed metoprolol 50 mg BID, as patient has history of headaches and had extremity swelling with amlodipine. Lisinopril induced pancreatitis.

## 2016-07-17 ENCOUNTER — Telehealth: Payer: Self-pay | Admitting: Internal Medicine

## 2016-07-17 ENCOUNTER — Encounter: Payer: Self-pay | Admitting: Internal Medicine

## 2016-07-17 DIAGNOSIS — L509 Urticaria, unspecified: Secondary | ICD-10-CM

## 2016-07-17 MED ORDER — PREDNISONE 20 MG PO TABS: 20.0000 mg | ORAL_TABLET | Freq: Every day | ORAL | 0 refills | Status: DC

## 2016-07-17 NOTE — Telephone Encounter (Signed)
Spoke with patient over the phone. Had a reaction like this several years ago that only resolved with steroids. Feel it is reasonable to try short course of steroids, as patient is agreeable to follow-up in clinic tomorrow. Has family history of RA and chronic joint pain herself. May benefit from rheumatology referral and/or biopsy of lesion if other causes ruled out.

## 2016-07-17 NOTE — Telephone Encounter (Signed)
Is breaking out in hives. She has an appt tomorrow morning at 8:30 but was hoping to get some prenisone for tonight to get this under control. [facility] on [address] [place]

## 2016-07-18 ENCOUNTER — Ambulatory Visit (INDEPENDENT_AMBULATORY_CARE_PROVIDER_SITE_OTHER): Payer: 59 | Admitting: Student

## 2016-07-18 ENCOUNTER — Encounter: Payer: Self-pay | Admitting: Student

## 2016-07-18 ENCOUNTER — Other Ambulatory Visit: Payer: Self-pay | Admitting: Internal Medicine

## 2016-07-18 DIAGNOSIS — L508 Other urticaria: Secondary | ICD-10-CM | POA: Diagnosis not present

## 2016-07-18 DIAGNOSIS — I10 Essential (primary) hypertension: Secondary | ICD-10-CM

## 2016-07-18 MED ORDER — CETIRIZINE HCL 10 MG PO TABS: 10.0000 mg | ORAL_TABLET | Freq: Every day | ORAL | 11 refills | Status: DC

## 2016-07-18 MED ORDER — RANITIDINE HCL 150 MG PO TABS: 150.0000 mg | ORAL_TABLET | Freq: Two times a day (BID) | ORAL | 0 refills | Status: DC

## 2016-07-18 MED ORDER — METOPROLOL TARTRATE 50 MG PO TABS: 50.0000 mg | ORAL_TABLET | Freq: Two times a day (BID) | ORAL | 0 refills | Status: DC

## 2016-07-18 MED FILL — METOPROLOL TARTRATE 50 MG T: 50 | 30 days supply | Qty: 60 | Fill #0

## 2016-07-18 MED FILL — raNITIdine HCL 150 MG TABS: 150 | 30 days supply | Qty: 60 | Fill #0

## 2016-07-18 MED FILL — ALL DAY ALLERGY 10 MG TAB: 10 | 100 days supply | Qty: 100 | Fill #0

## 2016-07-18 NOTE — Progress Notes (Signed)
   Subjective:    Patient ID: Amy Osborne is a 40 y.o. old female.  HPI #Skin rash: got up from bed about 2:30 pm yesterday with itching all over. She noticed it first behind her ears and on her neck. Then she noted scattered rashes behind her elbows, on her neck, trunk and legs. She describes the rash as redness of the skin.  Denies abnormal sensation in her throat, shortness of breath or chest pain.  Denies new meds, new food, new soap, detergent, recent illness or cold like symptoms. Denies sick contacts. 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   Off note, patient states she had similar presentation in 2012 and went to ED. She was treated with steroid and Benadryl at that time  PMH: reviewed SH: Denies smoking  Review of Systems Per HPI Objective:   Vitals:   07/18/16 0845 07/18/16 0934  BP: (!) 145/76 (!) 136/92  Pulse: 62   Temp: 97.7 F (36.5 C)   TempSrc: Oral   Weight: 284 lb (128.8 kg)   Height: 5\' 6"  (1.676 m)     GEN: appears well, no apparent distress. CVS: RRR, normal s1 and s2 RESP: no increased work of breathing GI: soft, non-tender,non-distended, +BS SKIN: Erythematous skin patch over the back of her neck, elbows bilaterally, and abdomen. Right shin rash is blanchable. See pictures taken by the patient prior to starting prednisone. Her rash has markedly improved today.       NEURO: alert and oriented appropriately, no gross defecits  PSYCH: appropriate mood and affect     Assessment & Plan:  Acute urticaria History and exam findings suggestive for acute urticaria. Patient reports response to prednisone 20 mg daily. So I won't increase his steroid dose today. She is on on 2 of 5 days course of steroid. I have added ranitidine and Zyrtec today. Discussed return precautions including abnormal sensation in her throat, shortness of breath or chest pain or worsening of rash or other symptoms concerning to her as mentioned on AVS.

## 2016-07-18 NOTE — Assessment & Plan Note (Signed)
History and exam findings suggestive for acute urticaria. Patient reports response to prednisone 20 mg daily. So I won't increase his steroid dose today. She is on on 2 of 5 days course of steroid. I have added ranitidine and Zyrtec today. Discussed return precautions including abnormal sensation in her throat, shortness of breath or chest pain or worsening of rash or other symptoms concerning to her as mentioned on AVS.

## 2016-07-18 NOTE — Progress Notes (Deleted)
   Subjective:    Patient ID: Amy Osborne is a 40 y.o. old female.  HPI #  #  PMH: reviewed FMH: *** SH: ***  Review of Systems Per HPI Objective:   Vitals:   07/18/16 0845 07/18/16 0934  BP: (!) 145/76 (!) 136/92  Pulse: 62   Temp: 97.7 F (36.5 C)   TempSrc: Oral   Weight: 284 lb (128.8 kg)   Height: 5\' 6"  (1.676 m)     GEN: appears***, no apparent distress. HEENT:   Head: normocephalic and atraumatic,   Eyes: without conjunctival injection, sclera anicteric,   Ears: normal TM and ear canal,   Nares: ***rhinorrhea, congestion or erythema,   Oropharynx: mmm without erythema or exudation CVS: RRR, normal s1 and s2, no murmurs, no edema RESP: no increased work of breathing, good air movement bilaterally, no crackles or wheeze GI: soft, non-tender,non-distended, +BS, ***Carnett sign MSK:*** SKIN: *** ENDO: *** HEM: *** NEURO: alert and oriented appropriately, no gross defecits  PSYCH: appropriate mood and affect       Assessment & Plan:  Acute urticaria History and exam findings suggestive for acute urticaria. Patient reports response to prednisone 20 mg daily. So I won't increase his steroid dose today. She is on on 2 of 5 days course of steroid. I have added ranitidine and Zyrtec today. Discussed return precautions including abnormal sensation in her throat, shortness of breath or chest pain or worsening of rash or other symptoms concerning to her as mentioned on AVS.

## 2016-07-18 NOTE — Patient Instructions (Addendum)
It was great seeing you today! We have addressed the following issues today  1. Skin rash: This is likely an allergic reaction (urticaria). Unfortunately it is unclear what is triggering the skin rash. Continue taking the prednisone until you complete the course. I have also sent a prescription for cetirizine and ranitidine to your pharmacy that you can take along with you prednisone. Please don't hesitate to call us if you start having a different feeling in your throat or if you have shortness of breath, chest pain, fever or other symptoms concerning to you.    If we did any lab work today, and the results require attention, either me or my nurse will get in touch with you. If everything is normal, you will get a letter in mail. If you don't hear from us in two weeks, please give us a call. Otherwise, I look forward to talking with you again at our next visit. If you have any questions or concerns before then, please call the clinic at [phone].  Please bring all your medications to every doctors visit   Sign up for My Chart to have easy access to your labs results, and communication with your Primary care physician.    Please check-out at the front desk before leaving the clinic.   Take Care,

## 2016-08-20 ENCOUNTER — Other Ambulatory Visit: Payer: Self-pay | Admitting: Student

## 2016-08-20 DIAGNOSIS — I10 Essential (primary) hypertension: Secondary | ICD-10-CM

## 2016-08-20 MED ORDER — METOPROLOL TARTRATE 50 MG PO TABS: 50.0000 mg | ORAL_TABLET | Freq: Two times a day (BID) | ORAL | 2 refills | Status: DC

## 2016-08-30 ENCOUNTER — Telehealth: Payer: 59 | Admitting: Nurse Practitioner

## 2016-08-30 DIAGNOSIS — R05 Cough: Secondary | ICD-10-CM | POA: Diagnosis not present

## 2016-08-30 DIAGNOSIS — R059 Cough, unspecified: Secondary | ICD-10-CM

## 2016-08-30 DIAGNOSIS — J029 Acute pharyngitis, unspecified: Secondary | ICD-10-CM | POA: Diagnosis not present

## 2016-08-30 MED ORDER — AZITHROMYCIN 250 MG PO TABS: ORAL_TABLET | ORAL | 0 refills | Status: DC

## 2016-08-30 MED FILL — METOPROLOL TARTRATE 50 MG T: 50 | 30 days supply | Qty: 60 | Fill #0

## 2016-08-30 MED FILL — AZITHROMYCIN 250 MG TABLET: 250 | 5 days supply | Qty: 6 | Fill #0

## 2016-08-30 NOTE — Progress Notes (Signed)

## 2016-09-13 ENCOUNTER — Other Ambulatory Visit: Payer: Self-pay | Admitting: *Deleted

## 2016-09-13 DIAGNOSIS — L508 Other urticaria: Secondary | ICD-10-CM

## 2016-09-13 MED ORDER — RANITIDINE HCL 150 MG PO TABS
150.0000 mg | ORAL_TABLET | Freq: Two times a day (BID) | ORAL | 0 refills | Status: DC
Start: 2016-09-13 — End: 2017-04-24

## 2016-10-02 MED FILL — METOPROLOL TARTRATE 50 MG T: 50 | 30 days supply | Qty: 60 | Fill #1

## 2016-11-05 MED FILL — METOPROLOL TARTRATE 50 MG T: 50 | 30 days supply | Qty: 60 | Fill #2

## 2016-12-09 ENCOUNTER — Other Ambulatory Visit: Payer: Self-pay | Admitting: Internal Medicine

## 2016-12-09 DIAGNOSIS — I10 Essential (primary) hypertension: Secondary | ICD-10-CM

## 2016-12-09 NOTE — Telephone Encounter (Signed)
Pt needs a refill on BP medication. Pt uses [facility]. ep

## 2016-12-11 MED FILL — METOPROLOL TARTRATE 50 MG T: 50 | 30 days supply | Qty: 60 | Fill #0

## 2016-12-12 ENCOUNTER — Ambulatory Visit: Payer: 59 | Admitting: Internal Medicine

## 2017-01-08 DIAGNOSIS — H5213 Myopia, bilateral: Secondary | ICD-10-CM | POA: Diagnosis not present

## 2017-01-08 DIAGNOSIS — H524 Presbyopia: Secondary | ICD-10-CM | POA: Diagnosis not present

## 2017-01-08 DIAGNOSIS — H01003 Unspecified blepharitis right eye, unspecified eyelid: Secondary | ICD-10-CM | POA: Diagnosis not present

## 2017-01-08 DIAGNOSIS — H25013 Cortical age-related cataract, bilateral: Secondary | ICD-10-CM | POA: Diagnosis not present

## 2017-01-09 ENCOUNTER — Ambulatory Visit (INDEPENDENT_AMBULATORY_CARE_PROVIDER_SITE_OTHER): Payer: 59 | Admitting: Internal Medicine

## 2017-01-09 VITALS — BP 138/90 | HR 66 | Temp 97.9°F | Ht 66.0 in | Wt 308.0 lb

## 2017-01-09 DIAGNOSIS — I10 Essential (primary) hypertension: Secondary | ICD-10-CM

## 2017-01-09 DIAGNOSIS — F411 Generalized anxiety disorder: Secondary | ICD-10-CM | POA: Diagnosis not present

## 2017-01-09 DIAGNOSIS — IMO0001 Reserved for inherently not codable concepts without codable children: Secondary | ICD-10-CM

## 2017-01-09 DIAGNOSIS — Z6841 Body Mass Index (BMI) 40.0 and over, adult: Secondary | ICD-10-CM

## 2017-01-09 DIAGNOSIS — E669 Obesity, unspecified: Secondary | ICD-10-CM | POA: Diagnosis not present

## 2017-01-09 MED ORDER — ESCITALOPRAM OXALATE 10 MG PO TABS: 10.0000 mg | ORAL_TABLET | Freq: Every day | ORAL | 1 refills | Status: DC

## 2017-01-09 MED ORDER — METOPROLOL SUCCINATE ER 100 MG PO TB24: 100.0000 mg | ORAL_TABLET | Freq: Every day | ORAL | 2 refills | Status: DC

## 2017-01-09 MED FILL — METOPROLOL SUCC ER 100 MG T: 100 | 30 days supply | Qty: 30 | Fill #0

## 2017-01-09 MED FILL — ESCITALOPRAM 10 MG TABLET: 10 | 30 days supply | Qty: 30 | Fill #0

## 2017-01-09 NOTE — Patient Instructions (Addendum)
[name],  I have changed you blood pressure medication to toprol 100 mg daily.  Please start lexapro 10 mg daily.  Best, Dr. Sampson GoonFitzgerald

## 2017-01-09 NOTE — Progress Notes (Signed)
[facility] Progress Note  Subjective:  Amy Osborne is a 41 y.o. female who presents to follow-up blood pressure, anxiety, and to discuss weight loss.  Blood pressure: - On metoprolol tartrate 50 mg BID; previously tried amlodipine but had extremity swelling and tried lisinopril but had episode of pancreatitis shortly after starting so was stopped in case of medication contribution - Says headache frequency has gone down but is not gone -- recently saw Ophthalmology, however, and increased prescription recommended for myopia - Does not regularly check BP - To pursue weight loss surgery - Occasionally misses doses with BID dosing ROS: No dizziness  Anxiety: - Says is a chronic issue that she has tried to put off discussing but acknowledges it is not improving despite graduating from NICU nurse certification, which was a source of stress - Anxiety in the past has been more situational -- custody battle with previous husband, changes in job - Current stressors include building off of her husband's own anxiety problems (he plans to see [employer]), and raising a child with autism - Has never been on antidepressants to her knowledge but had tried [place] for sleep issues previously (no longer on) - Both her mother and sister are on antidepressants; she texted her mother to find out which ones--what her mother takes is lexapro and ativan - Does not drink ROS: No SI/HI  Weight loss: - Plans to see Dr. Andrey Campanile of [facility] for bariatric surgery (performed husband's surgery) - Reports that her insurance will need her to have at least 6 appointments tracking her weight loss prior to approval for surgery - Regarding weight loss attempts, has tried program through [facility] (on [address]) that involved taking phentermine and lost 60 lbs (2008); tried again in 2012 but could not tolerate phentermine and did not lose weight; tried Weight Watchers in  2010 without success; tried diet that involved meal prep and cutting out processed foods in 2014-15 and lost about 35 lbs but could not keep up meal prep due to demands of family life  Social: Former smoker  Objective: Blood pressure 138/90, pulse 66, temperature 97.9 F (36.6 C), temperature source Oral, height 5\' 6"  (1.676 m), weight (!) 308 lb (139.7 kg), SpO2 97 %. Body mass index is 49.71 kg/m. Constitutional: Obese female, in NAD Eyes: PERRLA Cardiovascular: RRR, S1, S2, no m/r/g.  Pulmonary/Chest: Effort normal and breath sounds normal. No respiratory distress.  Neurological: AOx3, no focal deficits. Psychiatric: Somewhat anxious mood and affect.  Vitals reviewed  GAD 7 : Generalized Anxiety Score 01/09/2017 04/12/2016  Nervous, Anxious, on Edge 3 3  Control/stop worrying 3 2  Worry too much - different things 2 2  Trouble relaxing 3 3  Restless 3 3  Easily annoyed or irritable 1 1  Afraid - awful might happen 0 0  Total GAD 7 Score 15 14  Anxiety Difficulty Very difficult Very difficult    Assessment/Plan: Essential hypertension - At goal of < 140/90 today - Will switch to 100 mg toprol once runs out of metoprolol tartrate to help with adherence  Generalized anxiety disorder - Scores in severe range on GAD-7 - Will start on lexapro 10 mg. Counseled on reasons to discontinue (worsening mood) and informed would likely need to titrate up - Plans to seek counseling through [facility] and maybe engage in co-sessions with her husband  Obesity - Sent fax to [facility] requesting specific forms for weight loss check-in visits  if applicable. Otherwise, will continue to ask patient about activity level and current eating plan   Follow-up in about 1 month for weight check and to reassess anxiety.  Meds ordered this encounter  Medications  . metoprolol succinate (TOPROL-XL) 100 MG 24 hr tablet    Sig: Take 1 tablet (100 mg total) by mouth daily.  Take with or immediately following a meal.    Dispense:  30 tablet    Refill:  2  . escitalopram (LEXAPRO) 10 MG tablet    Sig: Take 1 tablet (10 mg total) by mouth daily.    Dispense:  30 tablet    Refill:  1     Dani GobbleHillary Artrell Lawless, MD [facility], PGY-2

## 2017-01-10 ENCOUNTER — Encounter: Payer: Self-pay | Admitting: Internal Medicine

## 2017-01-10 NOTE — Assessment & Plan Note (Signed)
-   Scores in severe range on GAD-7 - Will start on lexapro 10 mg. Counseled on reasons to discontinue (worsening mood) and informed would likely need to titrate up - Plans to [facility] and maybe engage in co-sessions with her husband

## 2017-01-10 NOTE — Assessment & Plan Note (Signed)
-   Sent fax to [facility] requesting specific forms for weight loss check-in visits if applicable. Otherwise, will continue to ask patient about activity level and current eating plan

## 2017-01-10 NOTE — Assessment & Plan Note (Signed)
-   At goal of < 140/90 today - Will switch to 100 mg toprol once runs out of metoprolol tartrate to help with adherence

## 2017-02-11 MED FILL — ESCITALOPRAM 10 MG TABLET: 10 | 30 days supply | Qty: 30 | Fill #1

## 2017-02-11 MED FILL — METOPROLOL SUCC ER 100 MG T: 100 | 30 days supply | Qty: 30 | Fill #1

## 2017-03-14 MED FILL — METOPROLOL SUCC ER 100 MG T: 100 | 30 days supply | Qty: 30 | Fill #2

## 2017-03-25 ENCOUNTER — Telehealth: Payer: Self-pay | Admitting: Internal Medicine

## 2017-03-25 DIAGNOSIS — F411 Generalized anxiety disorder: Secondary | ICD-10-CM

## 2017-03-25 MED ORDER — ESCITALOPRAM OXALATE 10 MG PO TABS: 10.0000 mg | ORAL_TABLET | Freq: Every day | ORAL | 1 refills | Status: DC

## 2017-03-25 MED FILL — ESCITALOPRAM 10 MG TABLET: 10 | 30 days supply | Qty: 30 | Fill #0

## 2017-03-25 NOTE — Telephone Encounter (Signed)
Refilled lexapro. Will discuss dosing at follow-up appointment later this month.

## 2017-03-25 NOTE — Telephone Encounter (Signed)
Pt needs refill on Lexapro.  She has an appt scheduled to see dr fitzgerald may 31.  [facility]

## 2017-04-15 ENCOUNTER — Other Ambulatory Visit (HOSPITAL_COMMUNITY): Payer: Self-pay | Admitting: General Surgery

## 2017-04-17 ENCOUNTER — Other Ambulatory Visit: Payer: Self-pay | Admitting: Internal Medicine

## 2017-04-17 DIAGNOSIS — I10 Essential (primary) hypertension: Secondary | ICD-10-CM

## 2017-04-17 MED FILL — METOPROLOL SUCC ER 100 MG T: 100 | 30 days supply | Qty: 30 | Fill #0

## 2017-04-24 ENCOUNTER — Ambulatory Visit (INDEPENDENT_AMBULATORY_CARE_PROVIDER_SITE_OTHER): Payer: 59 | Admitting: Internal Medicine

## 2017-04-24 ENCOUNTER — Encounter: Payer: Self-pay | Admitting: Internal Medicine

## 2017-04-24 VITALS — BP 135/90 | HR 58 | Temp 97.8°F | Ht 66.0 in | Wt 309.0 lb

## 2017-04-24 DIAGNOSIS — M2550 Pain in unspecified joint: Secondary | ICD-10-CM | POA: Diagnosis not present

## 2017-04-24 DIAGNOSIS — Z7189 Other specified counseling: Secondary | ICD-10-CM | POA: Diagnosis not present

## 2017-04-24 DIAGNOSIS — F411 Generalized anxiety disorder: Secondary | ICD-10-CM | POA: Diagnosis not present

## 2017-04-24 DIAGNOSIS — Z6841 Body Mass Index (BMI) 40.0 and over, adult: Secondary | ICD-10-CM | POA: Diagnosis not present

## 2017-04-24 DIAGNOSIS — I10 Essential (primary) hypertension: Secondary | ICD-10-CM | POA: Diagnosis not present

## 2017-04-24 DIAGNOSIS — E118 Type 2 diabetes mellitus with unspecified complications: Secondary | ICD-10-CM | POA: Diagnosis not present

## 2017-04-24 DIAGNOSIS — L508 Other urticaria: Secondary | ICD-10-CM

## 2017-04-24 DIAGNOSIS — R52 Pain, unspecified: Secondary | ICD-10-CM

## 2017-04-24 LAB — POCT GLYCOSYLATED HEMOGLOBIN (HGB A1C): HEMOGLOBIN A1C: 6.7

## 2017-04-24 MED ORDER — METOPROLOL SUCCINATE ER 100 MG PO TB24: ORAL_TABLET | ORAL | 6 refills | Status: DC

## 2017-04-24 MED ORDER — CETIRIZINE HCL 10 MG PO TABS: 10.0000 mg | ORAL_TABLET | Freq: Every day | ORAL | 11 refills | Status: DC

## 2017-04-24 MED ORDER — DICLOFENAC SODIUM 1 % TD GEL: 2.0000 g | Freq: Four times a day (QID) | TRANSDERMAL | 1 refills | Status: DC

## 2017-04-24 MED ORDER — RANITIDINE HCL 150 MG PO TABS
150.0000 mg | ORAL_TABLET | Freq: Two times a day (BID) | ORAL | 3 refills | Status: DC
Start: 2017-04-24 — End: 2017-10-31

## 2017-04-24 MED ORDER — ESCITALOPRAM OXALATE 10 MG PO TABS: 10.0000 mg | ORAL_TABLET | Freq: Every day | ORAL | 6 refills | Status: DC

## 2017-04-24 MED FILL — raNITIdine HCL 150 MG TABS: 150 | 30 days supply | Qty: 60 | Fill #0

## 2017-04-24 MED FILL — ESCITALOPRAM 10 MG TABLET: 10 | 30 days supply | Qty: 30 | Fill #1

## 2017-04-24 MED FILL — ALL DAY ALLERGY 10 MG TAB: 10 | 100 days supply | Qty: 100 | Fill #0

## 2017-04-24 MED FILL — DICLOFENAC SODIUM 1% GEL: 1 | 13 days supply | Qty: 100 | Fill #0

## 2017-04-24 NOTE — Progress Notes (Signed)
[facility]  Subjective:  Amy Osborne is a 41 y.o. female with history of HTN, prediabetes, obesity and migraines who presents to discuss bariatric surgery and worsening joint pains.  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 Was most successful at Bariatric clinic using phentermine, losing 65 lbs the first time, but the second time she tried had palpitations and could not take phentermine.  - Requests labs be performed today as specified in presurgical evaluation (CMP, TSH, hgb A1c, lipid panel, iron level, vitamin B12, vitamin D, B1, hcg pregnancy, folic acid, CBC with diff)  Joint pains: - Reports worsening R knee pain, feeling very stiff in the morning, and increased stiffness in her hands - Takes ibuprofen 600 mg a couple times a day, and it is "no longer cutting it." - Describes pain as more of a "discomfort" in her legs, "almost like nervous energy" - Worried because her mother has RA - Feels like she is hobbling in the morning. Pain improves with hot showers and as she continues to be active during the day - Also has ongoing hives that improve with zantac and zyrtec - Also concerned because has had trouble swallowing 3 times over the last few weeks; sensation lasted about 5 minutes and concerned about autoimmune process and says sister had nodules on her thyroid ROS: No recent falls, denies lower extremity swelling, no vision changes  Social: Former smoker  Allergies  Allergen Reactions  . Amlodipine Other (See Comments)    Extremity swelling  . Lisinopril Other (See Comments)    Developed pancreatitis after 2 doses   Objective: Blood pressure  135/90, pulse (!) 58, temperature 97.8 F (36.6 C), temperature source Oral, height '5\' 6"'  (1.676 m), weight (!) 309 lb (140.2 kg), SpO2 97 %. Body mass index is 49.87 kg/m. Constitutional: Morbidly obese female, in NAD, very pleasant HENT: Small, nontender, symmetric thyroid  Cardiovascular: RRR, S1, S2, no m/r/g.  Pulmonary/Chest: Effort normal and breath sounds normal. No respiratory distress.  Musculoskeletal: No LE, grip strength 5-/5 bilaterally and no sensory deficits, moderate crepitus over both knees (R > L) Neurological: AOx3, no focal deficits. Skin: Skin is warm and dry. No rash noted.  Psychiatric: Somewhat anxious affect Vitals reviewed  Assessment/Plan: Obesity - Has not lost any weight. Moving forward with plan for bariatric surgery. - Obtain labs requested as above - To also have swallow study as part of presurgical assessment, so this will address swallowing concerns  Arthralgia - Chronic, worsening. 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 Recommended trying voltaren gel. - Will obtain ESR, RF, and ANA as morning stiffness somewhat concerning for RA  Type 2 diabetes mellitus with complication, without long-term current use of insulin (HCC) - Hgb A1c 6.7, compared to 5.9 a year ago. - Recommended starting metformin 500 mg BID - At follow-up discuss possibly starting gabapentin for possible nerve pain in hands and feet, though describes more as a stiffness and had normal diabetic foot exam today   Follow-up at earliest convenience for pap smear and removal of IUD.  Amy Floss, MD [employer], PGY-2

## 2017-04-24 NOTE — Progress Notes (Deleted)
[facility] Progress Note  Subjective:  Amy Osborne is a 41 y.o.  No chief complaint on file.   Past Medical History:  Diagnosis Date  . Anxiety   . Hypertension     Social History   Social History  . Marital status: Married    Spouse name: N/A  . Number of children: N/A  . Years of education: N/A   Occupational History  . Not on file.   Social History Main Topics  . Smoking status: Former Smoker    Types: Cigarettes    Quit date: 11/26/2003  . Smokeless tobacco: Never Used  . Alcohol use No  . Drug use: No  . Sexual activity: Not on file   Other Topics Concern  . Not on file   Social History Narrative  . No narrative on file    Allergies  Allergen Reactions  . Amlodipine Other (See Comments)    Extremity swelling  . Lisinopril Other (See Comments)    Developed pancreatitis after 2 doses    Objective: Blood pressure 135/90, pulse (!) 58, temperature 97.8 F (36.6 C), temperature source Oral, height 5\' 6"  (1.676 m), weight (!) 309 lb (140.2 kg), SpO2 97 %. Body mass index is 49.87 kg/m. Constitutional: Morbidly obese, very pleasant, in NAD HENT:  Cardiovascular: RRR, S1, S2, no m/r/g.  Pulmonary/Chest: Effort normal and breath sounds normal. No respiratory distress.  Abdominal: Soft. +BS, soft, NT, ND, no rebound or guarding.  Musculoskeletal: ** Neurological: AOx3, no focal deficits. Skin: Skin is warm and dry. No rash noted. No erythema.  Psychiatric: Normal mood and affect.  Vitals reviewed  Assessment/Plan: No problem-specific Assessment & Plan notes found for this encounter.   Follow-up at earliest convenience for pap smear and removal of IUD.  Amy GobbleHillary Abhay Godbolt, MD [facility], PGY-2

## 2017-04-24 NOTE — Patient Instructions (Signed)
Ms. Mal AmabileBrock,  I will call you with your lab results.  I would try diclofenac (voltaren gel) on your right knee and hands to see if this provides more relief.  Please make an appointment for IUD removal and pap smear.  Best, Dr. Sampson GoonFitzgerald

## 2017-04-25 MED ORDER — METFORMIN HCL 500 MG PO TABS: ORAL_TABLET | ORAL | 3 refills | Status: DC

## 2017-04-25 MED FILL — metFORMIN HCL 500 MG TABS: 500 | 30 days supply | Qty: 60 | Fill #0

## 2017-04-26 LAB — SPECIMEN STATUS REPORT

## 2017-04-27 DIAGNOSIS — E1165 Type 2 diabetes mellitus with hyperglycemia: Secondary | ICD-10-CM | POA: Insufficient documentation

## 2017-04-27 DIAGNOSIS — M2550 Pain in unspecified joint: Secondary | ICD-10-CM | POA: Insufficient documentation

## 2017-04-27 DIAGNOSIS — R7303 Prediabetes: Secondary | ICD-10-CM

## 2017-04-27 DIAGNOSIS — IMO0002 Reserved for concepts with insufficient information to code with codable children: Secondary | ICD-10-CM | POA: Insufficient documentation

## 2017-04-27 NOTE — Assessment & Plan Note (Signed)
-  Chronic, worsening. 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 Recommended trying voltaren gel. - Will obtain ESR, RF, and ANA as morning stiffness somewhat concerning for RA

## 2017-04-27 NOTE — Assessment & Plan Note (Signed)
-   Has not lost any weight. Moving forward with plan for bariatric surgery. - Obtain labs requested as above - To also have swallow study as part of presurgical assessment, so this will address swallowing concerns

## 2017-04-27 NOTE — Assessment & Plan Note (Signed)
-   Hgb A1c 6.7, compared to 5.9 a year ago. - Recommended starting metformin 500 mg BID - At follow-up discuss possibly starting gabapentin for possible nerve pain in hands and feet, though describes more as a stiffness and had normal diabetic foot exam today

## 2017-04-29 ENCOUNTER — Encounter: Payer: Self-pay | Admitting: Internal Medicine

## 2017-04-29 ENCOUNTER — Encounter: Payer: Self-pay | Admitting: Registered"

## 2017-04-29 ENCOUNTER — Encounter: Payer: 59 | Attending: General Surgery | Admitting: Registered"

## 2017-04-29 DIAGNOSIS — Z6841 Body Mass Index (BMI) 40.0 and over, adult: Secondary | ICD-10-CM | POA: Insufficient documentation

## 2017-04-29 DIAGNOSIS — E669 Obesity, unspecified: Secondary | ICD-10-CM

## 2017-04-29 DIAGNOSIS — Z713 Dietary counseling and surveillance: Secondary | ICD-10-CM | POA: Diagnosis not present

## 2017-04-29 LAB — VITAMIN D 1,25 DIHYDROXY
Vitamin D 1, 25 (OH)2 Total: 39 pg/mL
Vitamin D3 1, 25 (OH)2: 39 pg/mL

## 2017-04-29 LAB — FOLATE: FOLATE: 6.9 ng/mL (ref >3.0)

## 2017-04-29 LAB — LIPID PANEL
CHOL/HDL RATIO: 4.4 ratio (ref 0.0–4.4)
Cholesterol, Total: 168 mg/dL (ref 100–199)
HDL: 38 mg/dL — ABNORMAL LOW (ref >39)
LDL Calculated: 80 mg/dL (ref 0–99)
Triglycerides: 252 mg/dL — ABNORMAL HIGH (ref 0–149)
VLDL Cholesterol Cal: 50 mg/dL — ABNORMAL HIGH (ref 5–40)

## 2017-04-29 LAB — SEDIMENTATION RATE: SED RATE: 9 mm/h (ref 0–32)

## 2017-04-29 LAB — VITAMIN B12: Vitamin B-12: 885 pg/mL (ref 232–1245)

## 2017-04-29 LAB — VITAMIN B1: Thiamine: 108.4 nmol/L (ref 66.5–200.0)

## 2017-04-29 LAB — TSH: TSH: 2.64 u[IU]/mL (ref 0.450–4.500)

## 2017-04-29 LAB — RHEUMATOID FACTOR

## 2017-04-29 LAB — HCG, SERUM, QUALITATIVE: hCG,Beta Subunit,Qual,Serum: NEGATIVE m[IU]/mL (ref <6)

## 2017-04-29 LAB — IRON: IRON: 68 ug/dL (ref 27–159)

## 2017-04-29 LAB — ANA: ANA: NEGATIVE

## 2017-04-29 NOTE — Progress Notes (Signed)
Pre-Op Assessment Visit:  Pre-Operative RYGB Surgery  Medical Nutrition Therapy:  Appt start time: 3:15  End time:  4:10  Patient was seen on 04/29/2017 for Pre-Operative Nutrition Assessment. Assessment and letter of approval faxed to [facility] Bariatric Surgery Program coordinator on 04/29/2017.   Pt expectation of surgery: reduce medications, get rid of diabetes, better quality of life, lose weight  Pt expectation of Dietitian: better options to substitute, help correcting bad habits, recipes  Start weight at [facility]: 307.4 BMI: 51.15   Pt states husband had RYGB a few years ago with our program. Pt states she's not a morning person therefore not hungry and skips breakfast. Pt states she doesn't eat bread. Pt states she doesn't eat vegetables because family doesn't like them and only buys what they will eat because her family survives on her income. Pt states her husband doesn't work.   Per insurance, pt needs 0 SWL visits prior to surgery.    24 hr Dietary Recall: First Meal: skips Snack: none Second Meal: ham and cheese sandwich, chips or salad Snack: string cheese or chips or vanilla wafers Third Meal: grilled chicken or pork chops with baked potato or mac and cheese Snack: none Beverages: coffee w/ flavored creamer and splenda, water, diet citrus tea, sometimes diet soda, crystal light  Encouraged to engage in 150 minutes of moderate physical activity including cardiovascular and weight baring weekly  Handouts given during visit include:  . Pre-Op Goals . Bariatric Surgery Protein Shakes . Vitamin and Mineral Recommendations  During the appointment today the following Pre-Op Goals were reviewed with the patient: . Maintain or lose weight as instructed by your surgeon . Make healthy food choices . Begin to limit portion sizes . Limited concentrated sugars and fried foods . Keep fat/sugar in the single digits per serving on          food labels . Practice  CHEWING your food  (aim for 30 chews per bite or until applesauce consistency) . Practice not drinking 15 minutes before, during, and 30 minutes after each meal/snack . Avoid all carbonated beverages  . Avoid/limit caffeinated beverages  . Avoid all sugar-sweetened beverages . Consume 3 meals per day; eat every 3-5 hours . Make a list of non-food related activities . Aim for 64-100 ounces of FLUID daily  . Aim for at least 60-80 grams of PROTEIN daily . Look for a liquid protein source that contain ?15 g protein and ?5 g carbohydrate  (ex: shakes, drinks, shots) . Physical activity is an important part of a healthy lifestyle so keep it moving!  Follow diet recommendations listed below Energy and Macronutrient Recommendations: Calories: 1800 Carbohydrate: 200 Protein: 135 Fat: 50  Demonstrated degree of understanding via:  Teach Back   Teaching Method Utilized:  Visual Auditory Hands on  Barriers to learning/adherence to lifestyle change: none  Patient to call the [facility] to enroll in Pre-Op and Post-Op Nutrition Education when surgery date is scheduled.

## 2017-05-05 ENCOUNTER — Encounter: Payer: Self-pay | Admitting: Internal Medicine

## 2017-05-07 ENCOUNTER — Other Ambulatory Visit: Payer: Self-pay | Admitting: Internal Medicine

## 2017-05-07 DIAGNOSIS — Z01818 Encounter for other preprocedural examination: Secondary | ICD-10-CM

## 2017-05-14 ENCOUNTER — Ambulatory Visit (HOSPITAL_COMMUNITY)
Admission: RE | Admit: 2017-05-14 | Discharge: 2017-05-14 | Disposition: A | Discharge status: Home / Self Care | Payer: 59 | Source: Ambulatory Visit | Attending: General Surgery | Admitting: General Surgery

## 2017-05-14 ENCOUNTER — Other Ambulatory Visit: Payer: Self-pay

## 2017-05-14 DIAGNOSIS — K449 Diaphragmatic hernia without obstruction or gangrene: Secondary | ICD-10-CM | POA: Insufficient documentation

## 2017-05-14 DIAGNOSIS — Z01818 Encounter for other preprocedural examination: Secondary | ICD-10-CM | POA: Diagnosis not present

## 2017-05-30 MED FILL — ESCITALOPRAM 10 MG TABLET: 10 | 30 days supply | Qty: 30 | Fill #0

## 2017-05-30 MED FILL — METOPROLOL SUCC ER 100 MG T: 100 | 30 days supply | Qty: 30 | Fill #1

## 2017-06-02 ENCOUNTER — Encounter: Payer: 59 | Attending: General Surgery | Admitting: Skilled Nursing Facility1

## 2017-06-02 DIAGNOSIS — Z713 Dietary counseling and surveillance: Secondary | ICD-10-CM | POA: Insufficient documentation

## 2017-06-02 DIAGNOSIS — Z6841 Body Mass Index (BMI) 40.0 and over, adult: Secondary | ICD-10-CM | POA: Insufficient documentation

## 2017-06-02 DIAGNOSIS — E669 Obesity, unspecified: Secondary | ICD-10-CM

## 2017-06-04 NOTE — Progress Notes (Signed)
Pre-Operative Nutrition Class:  Appt start time: 1901   End time:  1830.  Patient was seen on 06/02/2017 for Pre-Operative Bariatric Surgery Education at [facility].   Surgery date:  Surgery type: RYGB Start weight at [facility]: 307.4  Samples given per MNT protocol. Patient educated on appropriate usage: celebrateMultivitamin Lot # [id number] Exp: 04/2018  celebrateCalcium Citrate Lot # [id number] Exp: 02/2018  Renee Pain Protein shake Lot # [id number] Exp: Apr 21, 2018  The following the learning objectives were met by the patient during this course:  Identify Pre-Op Dietary Goals and will begin 2 weeks pre-operatively  Identify appropriate sources of fluids and proteins   State protein recommendations and appropriate sources pre and post-operatively  Identify Post-Operative Dietary Goals and will follow for 2 weeks post-operatively  Identify appropriate multivitamin and calcium sources  Describe the need for physical activity post-operatively and will follow MD recommendations  State when to call healthcare provider regarding medication questions or post-operative complications  Handouts given during class include:  Pre-Op Bariatric Surgery Diet Handout  Protein Shake Handout  Post-Op Bariatric Surgery Nutrition Handout  [facility] Program Information Flyer  Support Group Information Flyer  [facility] Bariatric Supplements Price List  [facility]: [facility] per MD.

## 2017-06-09 ENCOUNTER — Encounter: Payer: Self-pay | Admitting: Neurology

## 2017-06-10 ENCOUNTER — Encounter: Payer: Self-pay | Admitting: Neurology

## 2017-06-10 ENCOUNTER — Ambulatory Visit (INDEPENDENT_AMBULATORY_CARE_PROVIDER_SITE_OTHER): Payer: 59 | Admitting: Neurology

## 2017-06-10 VITALS — BP 159/89 | HR 80 | Ht 65.0 in | Wt 309.0 lb

## 2017-06-10 DIAGNOSIS — R519 Headache, unspecified: Secondary | ICD-10-CM

## 2017-06-10 DIAGNOSIS — G4730 Sleep apnea, unspecified: Secondary | ICD-10-CM

## 2017-06-10 DIAGNOSIS — G44019 Episodic cluster headache, not intractable: Secondary | ICD-10-CM | POA: Diagnosis not present

## 2017-06-10 DIAGNOSIS — Z9884 Bariatric surgery status: Secondary | ICD-10-CM | POA: Diagnosis not present

## 2017-06-10 DIAGNOSIS — E669 Obesity, unspecified: Secondary | ICD-10-CM

## 2017-06-10 DIAGNOSIS — R0683 Snoring: Secondary | ICD-10-CM

## 2017-06-10 DIAGNOSIS — G4726 Circadian rhythm sleep disorder, shift work type: Secondary | ICD-10-CM

## 2017-06-10 DIAGNOSIS — R51 Headache: Secondary | ICD-10-CM | POA: Diagnosis not present

## 2017-06-10 DIAGNOSIS — G4710 Hypersomnia, unspecified: Secondary | ICD-10-CM | POA: Diagnosis not present

## 2017-06-10 NOTE — Progress Notes (Signed)
SLEEP MEDICINE CLINIC   Provider:  Melvyn Novas, M D  Primary Care Physician:  Casey Burkitt, MD   Referring Provider: Gaynelle Adu   Chief Complaint  Patient presents with  . New Patient (Initial Visit)    HPI:  Amy Osborne is a 41 y.o. female , seen here as in a referral  from Dr. 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  She is referred by her bariatric surgeon in preparation for a gastric bypass procedure, Roux and Y.    Sleep habits are as follows: She begun 18 months ago to work night shifts for the first time and has adjusted rather well. She takes 3 nights of 12 hours in a row. Most of the time she is able to sleep in daytime but estimates that she only gets between 5 and 6 hours of sleep. She gets home at around 8:30 AM, and his bed ready by 9:30, asleep by 10 AM. She is a very restless sleeper in daytime, her husband has called her " hurricane' . She has a sound machine , black out curtains, a sleep mask, an adjustable bed- elevated with 2 pillows.  She wake up a lot- she may have one bathroom break, if any. She rises at 4 PM, prepares dinner, and dresses for work which starts at [employer] PM.   the patient will go to bed at about midnight on a non-work- night, will be asleep within half an hour or so and will rise at 6:30 AM. She always relies on an alarm. She describes her nocturnal sleep equally fragmented interrupted and restless, she is tossing and turning and she's not sure how much sleep she really gets during her bedtime. It seems that a longer time in bed does not necessarily leave her more refreshed or restored. She wakes up with a dry mouth. She also reports being woken by headaches as well as waking sometimes up with headaches.  Headaches that wake her up from sleep are sharp and pounding almost like an ice pick pain- that seems to be penetrating her head through the eyes.   Sleep medical history and family sleep history: one  sister have undergone bariatric surgery, as has husband, cousins. They have reverted from [facility] and DM to normal lab results and live with less medication.  She is taking metoprolol for heart rate and blood pressure control and Celexa-Lexapro.   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  Quit smoking 14 years ago.  She did not smoke during her pregnancies. She does not drink alcohol. She may drink coffee about 3 AM during her night shift ,usually only one cup. She does drink sodas but caffeine free ones, twice a week an  iced tea.  Review of Systems: Out of a complete 14 system review, the patient complains of only the following symptoms, and all other reviewed systems are negative.  snoring, witnessed crescendo snoring.  Day time sleep is poor , night time sleep a little better.   Epworth score  8 , Fatigue severity score 39  , depression score n/a    Social History   Social  History  . Marital status: Married    Spouse name: N/A  . Number of children: N/A  . Years of education: N/A   Occupational History  . Not on file.   Social History Main Topics  . Smoking status: Former Smoker    Types: Cigarettes    Quit date: 11/26/2003  . Smokeless tobacco: Never Used  . Alcohol use No  . Drug use: No  . Sexual activity: Not on file   Other Topics Concern  . Not on file   Social History Narrative  . No narrative on file    Family History  Problem Relation Age of Onset  . Arthritis/Rheumatoid Mother   . Depression Mother   . Anxiety disorder Mother   . Cancer Father   . Diabetes Father   . Hyperlipidemia Father   . Hypertension Father   . Depression Sister   . Anxiety disorder Sister   . Thyroid nodules Sister    . Stroke Maternal Grandmother   . Alzheimer's disease Maternal Grandfather   . Heart disease Paternal Grandmother   . Diabetes Paternal Grandmother   . Hyperlipidemia Paternal Grandmother   . Hypertension Paternal Grandmother     Past Medical History:  Diagnosis Date  . Anxiety   . Diabetes mellitus without complication (HCC)   . GERD (gastroesophageal reflux disease)   . Hypertension   . Pancreatitis     Past Surgical History:  Procedure Laterality Date  . CHOLECYSTECTOMY      Current Outpatient Prescriptions  Medication Sig Dispense Refill  . cetirizine (ZYRTEC) 10 MG tablet Take 1 tablet (10 mg total) by mouth daily. 30 tablet 11  . diclofenac sodium (VOLTAREN) 1 % GEL Apply 2 g topically 4 (four) times daily. 100 g 1  . escitalopram (LEXAPRO) 10 MG tablet Take 1 tablet (10 mg total) by mouth daily. 30 tablet 6  . metFORMIN (GLUCOPHAGE) 500 MG tablet Please take 500 mg with breakfast for 1 week. If you tolerate this well, please take 500 mg with breakfast and dinner. 60 tablet 3  . metoprolol succinate (TOPROL-XL) 100 MG 24 hr tablet TAKE 1 TABLET (100 MG TOTAL) BY MOUTH DAILY. TAKE WITH FOOD OR IMMEDIATELY FOLLOWING A MEAL. 30 tablet 6  . ranitidine (ZANTAC) 150 MG tablet Take 1 tablet (150 mg total) by mouth 2 (two) times daily. 60 tablet 3   No current facility-administered medications for this visit.     Allergies as of 06/10/2017 - Review Complete 06/10/2017  Allergen Reaction Noted  . Amlodipine Other (See Comments) 06/13/2016  . Lisinopril Other (See Comments) 04/17/2016    Vitals: BP (!) 159/89   Pulse 80   Ht 5\' 5"  (1.651 m)   Wt (!) 309 lb (140.2 kg)   BMI 51.42 kg/m  Last Weight:  Wt Readings from Last 1 Encounters:  06/10/17 (!) 309 lb (140.2 kg)   ZOX:WRUEBMI:Body mass index is 51.42 kg/m.     Last Height:   Ht Readings from Last 1 Encounters:  06/10/17 5\' 5"  (1.651 m)    Physical exam:  General: The patient is awake, alert and appears not in acute  distress. The patient is well groomed. Head: Normocephalic, atraumatic. Neck is supple. Mallampati 3 ,  neck circumference:17. Nasal airflow congestion, TMJ click and TMJ pain - right sided . Retrognathia is not seen.  Cardiovascular:  Regular rate and rhythm , without  murmurs or carotid bruit, and without distended neck veins. Respiratory: Lungs are clear to auscultation. Skin:  Without evidence of edema, or rash Trunk: BMI is 51. The patient's posture is erect.  Neurologic exam : The patient is awake and alert, oriented to place and time.   Memory subjective described as intact.  Attention span & concentration ability appears normal.  Speech is fluent,  without  dysarthria, dysphonia or aphasia.  Mood and affect are appropriate.  Cranial nerves: Pupils are equal and briskly reactive to light. Funduscopic exam without  evidence of pallor or edema.  Extraocular movements  in vertical and horizontal planes intact and without nystagmus. Hearing to finger rub intact. Right ear hearing loss - Facial sensation intact to fine touch. Facial motor strength is symmetric and tongue and uvula move midline. Shoulder shrug was symmetrical.   Motor exam: Normal tone, muscle bulk and symmetric strength in all extremities. Sensory:  Fine touch, pinprick and vibration were tested in all extremities. Proprioception tested in the upper extremities was normal. Coordination: Rapid alternating movements in the fingers/hands was normal. Finger-to-nose maneuver  normal without evidence of ataxia, dysmetria or tremor. Gait and station: Patient walks without assistive device and is able unassisted to climb up to the exam table. Strength within normal limits.  Stance is stable and normal.  Turns with  3 Steps. Romberg testing is negative. Deep tendon reflexes: in the  upper and lower extremities are symmetric and intact. Babinski maneuver response is downgoing.   Assessment:  After physical and neurologic  examination, review of laboratory studies,  Personal review of imaging studies, reports of other /same  Imaging studies, results of polysomnography and / or neurophysiology testing and pre-existing records as far as provided in visit., my assessment is   1) super obesity -nurse progress in preparation for a bariatric surgery the classic gastric bypass surgery and affect.  2) the patient is a shift worker with reduced sleep efficiency and daytime but reports an almost equally poor sleep quality at night. Her husband has observed that she snores sometimes has crescendo snoring, Which indicates a high risk of underlying sleep apnea. 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 I will recommend that the Patient tries 5 mg of melatonin prior to intended bedtime at night or in daytime to help her sleep  through. 4) TMJ pain and click- not a dental candidate.   The patient was advised of the nature of the diagnosed disorder , the treatment options and the  risks for general health and wellness arising from not treating the condition.  We will schedule her for a split night polysomnography with capnography given that she also reports sleep related headaches, morning headaches and cluster headaches.  I spent more than 40  minutes of face to face time with the patient.  Greater than 50% of time was spent in counseling and coordination of care. We have discussed the diagnosis and differential and I answered the patient's questions.    Plan:  Treatment plan and additional workup : Melatonin 5 mg 30 minutes prior to sleep time.  SPLIt night PSG with Co2- SPLIT at AHI 20- patient will need auto-titration device as postsurgical weight loss is expected.     RV in 6 month   Melvyn Novas, MD 06/10/2017, 8:58 AM  Certified in Neurology by ABPN Certified in Sleep Medicine by [facility] [address], [address] [place], [place] [address]

## 2017-07-02 MED FILL — ESCITALOPRAM 10 MG TABLET: 10 | 30 days supply | Qty: 30 | Fill #1

## 2017-07-02 MED FILL — metFORMIN HCL 500 MG TABS: 500 | 30 days supply | Qty: 60 | Fill #1

## 2017-07-02 MED FILL — METOPROLOL SUCC ER 100 MG T: 100 | 30 days supply | Qty: 30 | Fill #2

## 2017-07-02 MED FILL — DICLOFENAC SODIUM 1% GEL: 1 | 13 days supply | Qty: 100 | Fill #1

## 2017-07-14 ENCOUNTER — Telehealth: Payer: Self-pay | Admitting: Neurology

## 2017-07-14 NOTE — Telephone Encounter (Signed)
We have attempted to call the patient 3 times to schedule sleep study. Patient has been unavailable at the phone numbers we have on file and has not returned our calls. At this point we will send a letter asking pt to please contact the sleep lab to schedule their sleep study. If patient calls back we will schedule them for their sleep study. ° °

## 2017-07-31 MED FILL — ESCITALOPRAM 10 MG TABLET: 10 | 30 days supply | Qty: 30 | Fill #2

## 2017-07-31 MED FILL — metFORMIN HCL 500 MG TABS: 500 | 30 days supply | Qty: 60 | Fill #2

## 2017-07-31 MED FILL — ALL DAY ALLERGY 10 MG TAB: 10 | 100 days supply | Qty: 100 | Fill #1

## 2017-07-31 MED FILL — raNITIdine HCL 150 MG TABS: 150 | 30 days supply | Qty: 60 | Fill #1

## 2017-07-31 MED FILL — METOPROLOL SUCC ER 100 MG T: 100 | 30 days supply | Qty: 30 | Fill #0

## 2017-09-02 MED FILL — raNITIdine HCL 150 MG TABS: 150 | 30 days supply | Qty: 60 | Fill #2

## 2017-09-02 MED FILL — METOPROLOL SUCC ER 100 MG T: 100 | 30 days supply | Qty: 30 | Fill #1

## 2017-09-02 MED FILL — ESCITALOPRAM 10 MG TABLET: 10 | 30 days supply | Qty: 30 | Fill #3

## 2017-09-02 MED FILL — metFORMIN HCL 500 MG TABS: 500 | 30 days supply | Qty: 60 | Fill #3

## 2017-09-03 ENCOUNTER — Ambulatory Visit
Admission: RE | Admit: 2017-09-03 | Discharge: 2017-09-03 | Disposition: A | Discharge status: Home / Self Care | Payer: 59 | Source: Ambulatory Visit | Attending: Family Medicine | Admitting: Family Medicine

## 2017-09-03 ENCOUNTER — Ambulatory Visit (INDEPENDENT_AMBULATORY_CARE_PROVIDER_SITE_OTHER): Payer: 59 | Admitting: Internal Medicine

## 2017-09-03 ENCOUNTER — Encounter: Payer: Self-pay | Admitting: Internal Medicine

## 2017-09-03 VITALS — BP 162/88 | HR 69 | Temp 98.2°F | Ht 66.0 in | Wt 308.8 lb

## 2017-09-03 DIAGNOSIS — G8929 Other chronic pain: Secondary | ICD-10-CM

## 2017-09-03 DIAGNOSIS — R52 Pain, unspecified: Secondary | ICD-10-CM

## 2017-09-03 DIAGNOSIS — M25562 Pain in left knee: Secondary | ICD-10-CM

## 2017-09-03 DIAGNOSIS — M2550 Pain in unspecified joint: Secondary | ICD-10-CM

## 2017-09-03 DIAGNOSIS — Z01818 Encounter for other preprocedural examination: Secondary | ICD-10-CM | POA: Diagnosis not present

## 2017-09-03 DIAGNOSIS — I10 Essential (primary) hypertension: Secondary | ICD-10-CM | POA: Diagnosis not present

## 2017-09-03 DIAGNOSIS — M25561 Pain in right knee: Principal | ICD-10-CM

## 2017-09-03 DIAGNOSIS — E118 Type 2 diabetes mellitus with unspecified complications: Secondary | ICD-10-CM

## 2017-09-03 LAB — POCT GLYCOSYLATED HEMOGLOBIN (HGB A1C): HEMOGLOBIN A1C: 6.3

## 2017-09-03 MED ORDER — DICLOFENAC SODIUM 1 % TD GEL: 4.0000 g | Freq: Four times a day (QID) | TRANSDERMAL | 6 refills | Status: DC

## 2017-09-03 MED ORDER — METOPROLOL SUCCINATE ER 100 MG PO TB24: ORAL_TABLET | ORAL | 6 refills | Status: DC

## 2017-09-03 MED FILL — DICLOFENAC SODIUM 1% GEL: 1 | 10 days supply | Qty: 100 | Fill #0

## 2017-09-03 NOTE — Patient Instructions (Signed)
Amy Osborne,  Let's start with knee xrays.   Please return in about 1 week for blood pressure recheck and possible steroid injection of right knee.  Take 2 tablets of the metoprolol until follow-up.  Best, Dr. Sampson Goon

## 2017-09-03 NOTE — Progress Notes (Signed)
[facility] Progress Note  Subjective:  Amy Osborne is a 41 y.o. female with history of anxiety, T2DM, HTN, GERD and morbid obesity who presents for concern about joint pain.   #Joint pain: - Pain is described as sore and achy. She has been experiencing joint pains for about 6 months. Initially her right knee was bothering her most, but over the last 2-3 months she says her wrists, hands, feet, and both knees have been bothering her most of the time. 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 She notes that her hands sometimes look puffy and get warm. Rainy weather can make symptoms worse, but now she is hurting all the time. Initially, she had pains moreso in the morning that got better as day went on.  - Voltaren gel does help, but she feels like she needs to apply it everywhere. This can bring pain down from a 6 to a 4 out of 10. She will take either ibuprofen 600 mg BID or naproxen, which does provide relief. Tylenol does not help. She does not regularly exercise due to her work schedule and pain but did find some relief with swimming over the summer.  -She is concerned because her dad has OA and her mom is treated for RA. She says her mom's labs for RA were "initially negative" but that her mom's symptoms improved with treatment. - Reports increased fatigue. Says she can sleep a whole day and still feel tired.  ROS: No decreased sensation, no falls  #BP elevated: - On metoprolol 100 mg XR - Tried lisinopril but got pancreatitis shortly after starting this - Continues to have headaches - Has not been watching salt intake  #T2DM: - Started metformin this summer - Not really watching what she is eating. Has met with a nutritionist once as part of pre-bariatric surgery process.   Social: Former smoker  Allergies  Allergen Reactions  . Amlodipine Other (See Comments)    Extremity swelling  . Lisinopril Other (See Comments)    Developed pancreatitis after 2 doses    From 04/24/17: ANA negative Sed rate 9 RF < 10 TSH 2.640  Objective: Blood pressure (!) 162/88, pulse 69, temperature 98.2 F (36.8 C), temperature source Oral, height '5\' 6"'  (1.676 m), weight (!) 308 lb 12.8 oz (140.1 kg), SpO2 99 %. Body mass index is 49.84 kg/m. Constitutional: Obese female in NAD, pleasant HENT: MMM Cardiovascular: RRR, S1, S2, no m/r/g.  Pulmonary/Chest: Effort normal and breath sounds normal.  Musculoskeletal: No obvious swelling at knees, wrists or elbows. No crepitus of knees. FROM of UEs and LEs. Grip strength 5/5. TTP over [facility] joint bilaterally. Medial joint line TTP of bilateral knees.  Neurological: AOx3, no focal deficits. Skin: Skin is warm and dry. No rash noted.  Psychiatric: Anxious affect.  Vitals reviewed  Assessment/Plan: Pain in joints - Pain somewhat responsive to NSAIDs. Recent negative inflammatory markers as above (ANA, RF, ESR). Pain worst at knees, so considering early OA in setting of morbid obesity. No known tick exposures to suggest tick-borne illness. History of mood disorder could indicate anxiety/depression not optimally controlled. Could be fibromyalgia given multiple trigger points.  - Will obtain xrays of knees to see if patient has early arthritis. Could consider steroid injection if that is the case. - Recommended finding an exercise she might enjoy doing - Could consider short course of steroids for diagnostic purposes but explained to patient that harms likely outweighed the risk (poor wound healing with upcoming surgery,  worsening blood sugars, increasing BP) - Patient may be interested in second opinion from Rheumatology. She plans to discuss with her mother who she sees.  - Encouraged patient that at least some of this discomfort will be relieved with weight loss - Refilled voltaren gel  Essential hypertension - Above goal. Increase toprol to 200 mg daily. - Limit salt to < 2 g.  Type 2 diabetes mellitus with  complication, without long-term current use of insulin (HCC) - A1c improved from 6.7 to 6.3 - Continue metformin  Ordered CMP and CBC as part of patient's request pre-op labs.   Follow-up in about 1 week for BP recheck and at earliest convenience for IUD removal and pap smear.  Olene Floss, MD [place], PGY-3

## 2017-09-04 DIAGNOSIS — M2550 Pain in unspecified joint: Secondary | ICD-10-CM | POA: Insufficient documentation

## 2017-09-04 LAB — COMPREHENSIVE METABOLIC PANEL
ALBUMIN: 4.1 g/dL (ref 3.5–5.5)
ALK PHOS: 100 IU/L (ref 39–117)
ALT: 84 IU/L — ABNORMAL HIGH (ref 0–32)
AST: 54 IU/L — ABNORMAL HIGH (ref 0–40)
Albumin/Globulin Ratio: 1.8 (ref 1.2–2.2)
BILIRUBIN TOTAL: 0.4 mg/dL (ref 0.0–1.2)
BUN / CREAT RATIO: 11 (ref 9–23)
BUN: 11 mg/dL (ref 6–24)
CHLORIDE: 103 mmol/L (ref 96–106)
CO2: 25 mmol/L (ref 20–29)
Calcium: 9.2 mg/dL (ref 8.7–10.2)
Creatinine, Ser: 0.96 mg/dL (ref 0.57–1.00)
GFR calc non Af Amer: 73 mL/min/{1.73_m2} (ref >59)
GFR, EST AFRICAN AMERICAN: 84 mL/min/{1.73_m2} (ref >59)
GLUCOSE: 95 mg/dL (ref 65–99)
Globulin, Total: 2.3 g/dL (ref 1.5–4.5)
Potassium: 4.5 mmol/L (ref 3.5–5.2)
Sodium: 141 mmol/L (ref 134–144)
TOTAL PROTEIN: 6.4 g/dL (ref 6.0–8.5)

## 2017-09-04 LAB — CBC WITH DIFFERENTIAL
BASOS ABS: 0 10*3/uL (ref 0.0–0.2)
BASOS: 0 %
EOS (ABSOLUTE): 0.3 10*3/uL (ref 0.0–0.4)
Eos: 3 %
HEMOGLOBIN: 13.6 g/dL (ref 11.1–15.9)
Hematocrit: 42.1 % (ref 34.0–46.6)
IMMATURE GRANS (ABS): 0 10*3/uL (ref 0.0–0.1)
Immature Granulocytes: 0 %
LYMPHS ABS: 2.3 10*3/uL (ref 0.7–3.1)
LYMPHS: 28 %
MCH: 28.4 pg (ref 26.6–33.0)
MCHC: 32.3 g/dL (ref 31.5–35.7)
MCV: 88 fL (ref 79–97)
Monocytes Absolute: 0.5 10*3/uL (ref 0.1–0.9)
Monocytes: 5 %
NEUTROS ABS: 5.2 10*3/uL (ref 1.4–7.0)
NEUTROS PCT: 64 %
RBC: 4.79 x10E6/uL (ref 3.77–5.28)
RDW: 14.2 % (ref 12.3–15.4)
WBC: 8.3 10*3/uL (ref 3.4–10.8)

## 2017-09-04 NOTE — Assessment & Plan Note (Signed)
-  Pain somewhat responsive to NSAIDs. Recent negative inflammatory markers as above (ANA, RF, ESR). Pain worst at knees, so considering early OA in setting of morbid obesity. No known tick exposures to suggest tick-borne illness. History of mood disorder could indicate anxiety/depression not optimally controlled. Could be fibromyalgia given multiple trigger points.  - Will obtain xrays of knees to see if patient has early arthritis. Could consider steroid injection if that is the case. - Recommended finding an exercise she might enjoy doing - Could consider short course of steroids for diagnostic purposes but explained to patient that harms likely outweighed the risk (poor wound healing with upcoming surgery, worsening blood sugars, increasing BP) - Patient may be interested in second opinion from Rheumatology. She plans to discuss with her mother who she sees.  - Encouraged patient that at least some of this discomfort will be relieved with weight loss - Refilled voltaren gel

## 2017-09-04 NOTE — Assessment & Plan Note (Signed)
-   Above goal. Increase toprol to 200 mg daily. - Limit salt to < 2 g.

## 2017-09-04 NOTE — Assessment & Plan Note (Signed)
-   A1c improved from 6.7 to 6.3 - Continue metformin

## 2017-10-02 ENCOUNTER — Other Ambulatory Visit: Payer: Self-pay | Admitting: Internal Medicine

## 2017-10-02 DIAGNOSIS — E118 Type 2 diabetes mellitus with unspecified complications: Secondary | ICD-10-CM

## 2017-10-02 MED FILL — metFORMIN HCL 500 MG TABS: 500 | 30 days supply | Qty: 60 | Fill #0

## 2017-10-02 MED FILL — METOPROLOL SUCC ER 100 MG T: 100 | 30 days supply | Qty: 30 | Fill #2

## 2017-10-06 MED FILL — ESCITALOPRAM 10 MG TABLET: 10 | 30 days supply | Qty: 30 | Fill #4

## 2017-10-06 MED FILL — raNITIdine HCL 150 MG TABS: 150 | 30 days supply | Qty: 60 | Fill #3

## 2017-10-31 ENCOUNTER — Other Ambulatory Visit: Payer: Self-pay | Admitting: Internal Medicine

## 2017-10-31 DIAGNOSIS — L508 Other urticaria: Secondary | ICD-10-CM

## 2017-10-31 MED FILL — METOPROLOL SUCC ER 100 MG T: 100 | 30 days supply | Qty: 30 | Fill #3

## 2017-10-31 MED FILL — ESCITALOPRAM 10 MG TABLET: 10 | 30 days supply | Qty: 30 | Fill #5

## 2017-10-31 MED FILL — raNITIdine HCL 150 MG TABS: 150 | 30 days supply | Qty: 60 | Fill #0

## 2017-10-31 MED FILL — metFORMIN HCL 500 MG TABS: 500 | 30 days supply | Qty: 60 | Fill #1

## 2017-11-03 ENCOUNTER — Telehealth: Payer: 59 | Admitting: Family

## 2017-11-03 ENCOUNTER — Encounter: Payer: Self-pay | Admitting: Internal Medicine

## 2017-11-03 DIAGNOSIS — R399 Unspecified symptoms and signs involving the genitourinary system: Secondary | ICD-10-CM

## 2017-11-03 MED ORDER — CEPHALEXIN 500 MG PO CAPS: 500.0000 mg | ORAL_CAPSULE | Freq: Two times a day (BID) | ORAL | 0 refills | Status: DC

## 2017-11-03 NOTE — Progress Notes (Signed)

## 2017-11-04 ENCOUNTER — Other Ambulatory Visit: Payer: Self-pay | Admitting: Internal Medicine

## 2017-11-04 DIAGNOSIS — I10 Essential (primary) hypertension: Secondary | ICD-10-CM

## 2017-11-04 MED FILL — CEPHALEXIN 500 MG CAPSULE: 500 | 7 days supply | Qty: 14 | Fill #0

## 2017-11-05 MED ORDER — METOPROLOL SUCCINATE ER 200 MG PO TB24: ORAL_TABLET | ORAL | 5 refills | Status: DC

## 2017-11-05 MED FILL — METOPROLOL SUCC ER 200 MG T: 200 | 30 days supply | Qty: 30 | Fill #0

## 2017-12-16 MED FILL — METOPROLOL SUCC ER 200 MG T: 200 | 30 days supply | Qty: 30 | Fill #1

## 2017-12-16 MED FILL — metFORMIN HCL 500 MG TABS: 500 | 30 days supply | Qty: 60 | Fill #2

## 2017-12-16 MED FILL — ESCITALOPRAM 10 MG TABLET: 10 | 30 days supply | Qty: 30 | Fill #6

## 2017-12-16 MED FILL — raNITIdine HCL 150 MG TABS: 150 | 30 days supply | Qty: 60 | Fill #1

## 2018-01-16 ENCOUNTER — Other Ambulatory Visit: Payer: Self-pay | Admitting: Internal Medicine

## 2018-01-16 DIAGNOSIS — F411 Generalized anxiety disorder: Secondary | ICD-10-CM

## 2018-01-16 MED FILL — raNITIdine HCL 150 MG TABS: 150 | 30 days supply | Qty: 60 | Fill #2

## 2018-01-16 MED FILL — DICLOFENAC SODIUM 1% GEL: 1 | 10 days supply | Qty: 100 | Fill #1

## 2018-01-16 MED FILL — METOPROLOL SUCC ER 200 MG T: 200 | 30 days supply | Qty: 30 | Fill #2

## 2018-01-16 MED FILL — metFORMIN HCL 500 MG TABS: 500 | 30 days supply | Qty: 60 | Fill #3

## 2018-01-16 MED FILL — ESCITALOPRAM 10 MG TABLET: 10 | 30 days supply | Qty: 30 | Fill #0

## 2018-01-16 NOTE — Telephone Encounter (Signed)
Pt calling for a refill on her lexapro. Her pharmacy said she needs some type of notification from her dr before they will refill it. She said she will be completely out if she waits until Monday

## 2018-02-17 ENCOUNTER — Other Ambulatory Visit: Payer: Self-pay | Admitting: Internal Medicine

## 2018-02-17 DIAGNOSIS — E118 Type 2 diabetes mellitus with unspecified complications: Secondary | ICD-10-CM

## 2018-02-17 MED FILL — raNITIdine HCL 150 MG TABS: 150 | 30 days supply | Qty: 60 | Fill #3

## 2018-02-17 MED FILL — ESCITALOPRAM 10 MG TABLET: 10 | 30 days supply | Qty: 30 | Fill #1

## 2018-02-17 MED FILL — METOPROLOL SUCC ER 200 MG T: 200 | 30 days supply | Qty: 30 | Fill #3

## 2018-02-17 MED FILL — DICLOFENAC SODIUM 1% GEL: 1 | 10 days supply | Qty: 100 | Fill #2

## 2018-03-20 ENCOUNTER — Other Ambulatory Visit: Payer: Self-pay | Admitting: Internal Medicine

## 2018-03-20 DIAGNOSIS — L508 Other urticaria: Secondary | ICD-10-CM

## 2018-03-20 MED FILL — METOPROLOL SUCC ER 200 MG T: 200 | 30 days supply | Qty: 30 | Fill #4

## 2018-03-20 MED FILL — metFORMIN HCL 500 MG TABS: 500 | 30 days supply | Qty: 60 | Fill #0

## 2018-03-20 MED FILL — ESCITALOPRAM 10 MG TABLET: 10 | 30 days supply | Qty: 30 | Fill #2

## 2018-04-21 MED FILL — METOPROLOL SUCC ER 200 MG T: 200 | 30 days supply | Qty: 30 | Fill #5

## 2018-04-21 MED FILL — ESCITALOPRAM 10 MG TABLET: 10 | 30 days supply | Qty: 30 | Fill #3

## 2018-04-21 MED FILL — raNITIdine HCL 150 MG TABS: 150 | 30 days supply | Qty: 60 | Fill #0

## 2018-04-21 MED FILL — DICLOFENAC SODIUM 1% GEL: 1 | 10 days supply | Qty: 100 | Fill #3

## 2018-04-21 MED FILL — metFORMIN HCL 500 MG TABS: 500 | 30 days supply | Qty: 60 | Fill #1

## 2018-04-28 ENCOUNTER — Encounter: Payer: Self-pay | Admitting: Family Medicine

## 2018-04-28 ENCOUNTER — Ambulatory Visit: Payer: 59 | Admitting: Internal Medicine

## 2018-04-28 ENCOUNTER — Ambulatory Visit (INDEPENDENT_AMBULATORY_CARE_PROVIDER_SITE_OTHER): Payer: 59 | Admitting: Family Medicine

## 2018-04-28 VITALS — BP 122/64 | HR 80 | Temp 98.5°F | Ht 65.0 in | Wt 302.0 lb

## 2018-04-28 DIAGNOSIS — S139XXA Sprain of joints and ligaments of unspecified parts of neck, initial encounter: Secondary | ICD-10-CM | POA: Diagnosis not present

## 2018-04-28 DIAGNOSIS — E118 Type 2 diabetes mellitus with unspecified complications: Secondary | ICD-10-CM

## 2018-04-28 DIAGNOSIS — F411 Generalized anxiety disorder: Secondary | ICD-10-CM

## 2018-04-28 LAB — POCT GLYCOSYLATED HEMOGLOBIN (HGB A1C): HbA1c, POC (controlled diabetic range): 5.9 % (ref 0.0–7.0)

## 2018-04-28 MED ORDER — ESCITALOPRAM OXALATE 20 MG PO TABS: 20.0000 mg | ORAL_TABLET | Freq: Every day | ORAL | 0 refills | Status: DC

## 2018-04-28 MED ORDER — BACLOFEN 10 MG PO TABS: 10.0000 mg | ORAL_TABLET | Freq: Three times a day (TID) | ORAL | 0 refills | Status: AC | PRN

## 2018-04-28 MED FILL — BACLOFEN 10 MG TABLET: 10 | 14 days supply | Qty: 30 | Fill #0

## 2018-04-28 MED FILL — ESCITALOPRAM 20 MG TABLET: 20 | 30 days supply | Qty: 30 | Fill #0
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
## 2018-04-28 NOTE — Patient Instructions (Signed)
Cervical Sprain A cervical sprain is a stretch or tear in the tissues that connect bones (ligaments) in the neck. Most neck (cervical) sprains get better in 4-6 weeks. Follow these instructions at home: If you have a neck collar:  Wear it as told by your doctor. Do not take off (do not remove) the collar unless your doctor says that this is safe.  Ask your doctor before adjusting your collar.  If you have long hair, keep it outside of the collar.  Ask your doctor if you may take off the collar for cleaning and bathing. If you may take off the collar:  Follow instructions from your doctor about how to take off the collar safely.  Clean the collar by wiping it with mild soap and water. Let it air-dry all the way.  If your collar has removable pads:  Take the pads out every 1-2 days.  Hand wash the pads with soap and water.  Let the pads air-dry all the way before you put them back in the collar. Do not dry them in a clothes dryer. Do not dry them with a hair dryer.  Check your skin under the collar for irritation or sores. If you see any, tell your doctor. Managing pain, stiffness, and swelling  Use a cervical traction device, if told by your doctor.  If told, put heat on the affected area. Do this before exercises (physical therapy) or as often as told by your doctor. Use the heat source that your doctor recommends, such as a moist heat pack or a heating pad.  Place a towel between your skin and the heat source.  Leave the heat on for 20-30 minutes.  Take the heat off (remove the heat) if your skin turns bright red. This is very important if you cannot feel pain, heat, or cold. You may have a greater risk of getting burned.  Put ice on the affected area.  Put ice in a plastic bag.  Place a towel between your skin and the bag.  Leave the ice on for 20 minutes, 2-3 times a day. Activity  Do not drive while wearing a neck collar. If you do not have a neck collar, ask your  doctor if it is safe to drive.  Do not drive or use heavy machinery while taking prescription pain medicine or muscle relaxants, unless your doctor approves.  Do not lift anything that is heavier than 10 lb (4.5 kg) until your doctor tells you that it is safe.  Rest as told by your doctor.  Avoid activities that make you feel worse. Ask your doctor what activities are safe for you.  Do exercises as told by your doctor or physical therapist. Preventing neck sprain  Practice good posture. Adjust your workstation to help with this, if needed.  Exercise regularly as told by your doctor or physical therapist.  Avoid activities that are risky or may cause a neck sprain (cervical sprain). General instructions  Take over-the-counter and prescription medicines only as told by your doctor.  Do not use any products that contain nicotine or tobacco. This includes cigarettes and e-cigarettes. If you need help quitting, ask your doctor.  Keep all follow-up visits as told by your doctor. This is important. Contact a doctor if:  You have pain or other symptoms that get worse.  You have symptoms that do not get better after 2 weeks.  You have pain that does not get better with medicine.  You start to have new,   unexplained symptoms.  You have sores or irritated skin from wearing your neck collar. Get help right away if:  You have very bad pain.  You have any of the following in any part of your body:  Loss of feeling (numbness).  Tingling.  Weakness.  You cannot move a part of your body (you have paralysis).  Your activity level does not improve. Summary  A cervical sprain is a stretch or tear in the tissues that connect bones (ligaments) in the neck.  If you have a neck (cervical) collar, do not take off the collar unless your doctor says that this is safe.  Put ice on affected areas as told by your doctor.  Put heat on affected areas as told by your doctor.  Good posture  and regular exercise can help prevent a neck sprain from happening again. This information is not intended to replace advice given to you by your health care provider. Make sure you discuss any questions you have with your health care provider. Document Released: 04/29/2008 Document Revised: 07/23/2016 Document Reviewed: 07/23/2016 Elsevier Interactive Patient Education  2017 Elsevier Inc.  

## 2018-04-28 NOTE — Assessment & Plan Note (Signed)
Mild improvement on Lexapro 10 mg qd. No suicidal or homicidal ideation. Increased to 20 mg qd today. Refill given. See PCP in 1-2 weeks for reassessment.

## 2018-04-28 NOTE — Assessment & Plan Note (Signed)
Well controlled. A1C 5.9 Continue current dose of Metformin. See PCP for further management.
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
## 2018-05-01 ENCOUNTER — Encounter: Payer: Self-pay | Admitting: Family Medicine

## 2018-05-04 DIAGNOSIS — S139XXA Sprain of joints and ligaments of unspecified parts of neck, initial encounter: Secondary | ICD-10-CM | POA: Insufficient documentation
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
## 2018-05-08 ENCOUNTER — Telehealth: Payer: Self-pay

## 2018-05-08 ENCOUNTER — Encounter: Payer: Self-pay | Admitting: Family Medicine

## 2018-05-08 NOTE — Telephone Encounter (Signed)
Advise her that letter edited

## 2018-05-08 NOTE — Telephone Encounter (Signed)
Patient has an appointment on 05-13-18. Charleigh Correnti,CMA

## 2018-05-08 NOTE — Progress Notes (Signed)
Insurance return to work letter

## 2018-05-08 NOTE — Telephone Encounter (Signed)
Received call on nurse line from patient requesting a letter for her insurance company. Letter needs to state dates out of work, return to work date, diagnoses and why patient could not work.  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 Pt stated she did not have full motion in her neck.   Pt would like this sent to her mychart if possible, if not she can come by the office to pick up.

## 2018-05-08 NOTE — Telephone Encounter (Signed)
Per her request, I will update the letter. Although my recommendation was for 1 week and return to PCP for reassessment if no improvement. Letter updated and placed up front for pick up.

## 2018-05-08 NOTE — Telephone Encounter (Signed)
Informed patient letter was ready for pick up. Pt stated she needs to be written out through 6/15. Pt would like for you to call her.

## 2018-05-08 NOTE — Telephone Encounter (Signed)
To you 

## 2018-05-08 NOTE — Telephone Encounter (Signed)
Please advise patient that I am unable to attach the letter to MyChart.  I also gave her 1 week off work from last visit with the instruction to follow-up with PCP soon if no improvement.  Letter is ready for pick up.

## 2018-05-08 NOTE — Telephone Encounter (Signed)
Patient left another message stating her letter should have her out through the 15th with a return to work date of 05/11/18.  Her number is [phone]  Ples SpecterAlisa Stiven Kaspar, RN [facility] RN)

## 2018-05-13 ENCOUNTER — Encounter: Payer: Self-pay | Admitting: Internal Medicine

## 2018-05-13 ENCOUNTER — Ambulatory Visit (INDEPENDENT_AMBULATORY_CARE_PROVIDER_SITE_OTHER): Payer: 59 | Admitting: Internal Medicine

## 2018-05-13 VITALS — BP 119/65 | HR 54 | Temp 98.0°F | Ht 65.0 in | Wt 301.0 lb

## 2018-05-13 DIAGNOSIS — F411 Generalized anxiety disorder: Secondary | ICD-10-CM | POA: Diagnosis not present

## 2018-05-13 DIAGNOSIS — Z5181 Encounter for therapeutic drug level monitoring: Secondary | ICD-10-CM

## 2018-05-13 DIAGNOSIS — S139XXD Sprain of joints and ligaments of unspecified parts of neck, subsequent encounter: Secondary | ICD-10-CM

## 2018-05-13 DIAGNOSIS — E669 Obesity, unspecified: Secondary | ICD-10-CM

## 2018-05-13 MED ORDER — CYCLOBENZAPRINE HCL 10 MG PO TABS: 10.0000 mg | ORAL_TABLET | Freq: Three times a day (TID) | ORAL | 1 refills | Status: DC | PRN

## 2018-05-13 MED FILL — CYCLOBENZAPRINE 10 MG TAB: 10 | 10 days supply | Qty: 30 | Fill #0
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
## 2018-05-13 NOTE — Patient Instructions (Signed)
[name],  Please follow-up with me next week to see if we should have you see physical therapy. Let me know [employer]'s fax number when you can.  Try 10 mg flexeril before bed and during the day as needed. Continue ibuprofen and try light stretching.  Please see me back to discuss bowel movement issues.  Best, Dr. Sampson GoonFitzgerald

## 2018-05-14 ENCOUNTER — Encounter: Payer: Self-pay | Admitting: Internal Medicine

## 2018-05-14 LAB — COMPREHENSIVE METABOLIC PANEL
ALK PHOS: 113 IU/L (ref 39–117)
ALT: 47 IU/L — ABNORMAL HIGH (ref 0–32)
AST: 32 IU/L (ref 0–40)
Albumin/Globulin Ratio: 1.8 (ref 1.2–2.2)
Albumin: 4.2 g/dL (ref 3.5–5.5)
BUN/Creatinine Ratio: 11 (ref 9–23)
BUN: 8 mg/dL (ref 6–24)
Bilirubin Total: 0.5 mg/dL (ref 0.0–1.2)
CALCIUM: 9.1 mg/dL (ref 8.7–10.2)
CO2: 24 mmol/L (ref 20–29)
CREATININE: 0.71 mg/dL (ref 0.57–1.00)
Chloride: 101 mmol/L (ref 96–106)
GFR calc Af Amer: 121 mL/min/{1.73_m2} (ref >59)
GFR, EST NON AFRICAN AMERICAN: 105 mL/min/{1.73_m2} (ref >59)
GLOBULIN, TOTAL: 2.4 g/dL (ref 1.5–4.5)
Glucose: 97 mg/dL (ref 65–99)
Potassium: 4.5 mmol/L (ref 3.5–5.2)
SODIUM: 138 mmol/L (ref 134–144)
Total Protein: 6.6 g/dL (ref 6.0–8.5)

## 2018-05-14 LAB — LIPID PANEL
CHOL/HDL RATIO: 6.1 ratio — AB (ref 0.0–4.4)
CHOLESTEROL TOTAL: 250 mg/dL — AB (ref 100–199)
HDL: 41 mg/dL (ref >39)
LDL CALC: 137 mg/dL — AB (ref 0–99)
TRIGLYCERIDES: 360 mg/dL — AB (ref 0–149)
VLDL CHOLESTEROL CAL: 72 mg/dL — AB (ref 5–40)

## 2018-05-15 ENCOUNTER — Encounter: Payer: Self-pay | Admitting: Internal Medicine

## 2018-05-17 ENCOUNTER — Encounter: Payer: Self-pay | Admitting: Internal Medicine
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
## 2018-05-17 NOTE — Assessment & Plan Note (Addendum)
-   Will obtain lipid panel and CMP to check cholesterol and AST, ALT

## 2018-05-17 NOTE — Assessment & Plan Note (Signed)
-   Ongoing symptoms. Explained that duration of symptoms is not uncommonly several weeks, up to 6 weeks. - Recommended trying different muscle relaxant. Provided prescription for flexeril. Recommended light stretching exercises and continuing ibuprofen. - Would refer to physical therapy if not noting improvement by follow-up next week - Provided work note for dates missed and will re-evaluate next week

## 2018-05-18 ENCOUNTER — Telehealth: Payer: Self-pay | Admitting: Internal Medicine

## 2018-05-18 ENCOUNTER — Telehealth: Payer: Self-pay

## 2018-05-18 MED ORDER — ATORVASTATIN CALCIUM 40 MG PO TABS: 40.0000 mg | ORAL_TABLET | Freq: Every day | ORAL | 0 refills | Status: DC

## 2018-05-18 MED FILL — ATORVASTATIN 40 MG TABLET: 40 | 90 days supply | Qty: 90 | Fill #0

## 2018-05-18 NOTE — Telephone Encounter (Signed)
Pt came in office dropped her FMLA Form requesting to be filled and signed by MD. There was a form that was faxed here by [facility] for the Pt's short term disability in able to get paid by employer, Monia [place] form was placed in MD's box. Last DOS 04-28-2018. Best phone # to contact pt is [phone]. FMLA form was placed in Red team folder.

## 2018-05-18 NOTE — Telephone Encounter (Signed)
Patient left message with the following:  Amy Osborne has emailed PCP with information that needs to be completed and sent back as soon as possible.  [employer] should have faxed some paperwork for completion last week. If PCP has not received, patient has hard copy she can bring by.  May also get paperwork to complete from [facility].  Patient would like to know if these have been received and have been responded to or if she needs to bring the ones from [employer] by to drop off.   Call back is [phone]  Ples SpecterAlisa Terisha Losasso, RN [facility] RN)

## 2018-05-18 NOTE — Telephone Encounter (Signed)
Reviewed FMLA paperwork request and placed in Dr. Jarrett AblesFitzgerald's box for completion. No clinical actions were needed.  Glennie Hawk.Willys Salvino R, CMA

## 2018-05-18 NOTE — Telephone Encounter (Signed)
Called patient to let her know I had received paperwork from [employer] and [employer]. These were completed today and placed in fax pile. Have not received any information from [facility].  Claim numbers provided by patient:  [employer] - Leave #: [id number], Attn: Wilder GladeBernadette Maurer, fax [phone] [facility] - Claim #: [id number], fax [phone] [facility] - Claim # [id number]  Also discussed high triglycerides with patient. Notably, she has history of pancreatitis. Plan to start treatment with atorvastatin, as LDL also elevated.   Dani GobbleHillary Fitzgerald, MD [facility], PGY-3

## 2018-05-21 ENCOUNTER — Encounter: Payer: Self-pay | Admitting: Internal Medicine

## 2018-05-21 ENCOUNTER — Other Ambulatory Visit: Payer: Self-pay

## 2018-05-21 ENCOUNTER — Ambulatory Visit (INDEPENDENT_AMBULATORY_CARE_PROVIDER_SITE_OTHER): Payer: 59 | Admitting: Internal Medicine

## 2018-05-21 VITALS — BP 122/74 | HR 81 | Temp 98.1°F | Ht 65.0 in | Wt 301.8 lb

## 2018-05-21 DIAGNOSIS — R195 Other fecal abnormalities: Secondary | ICD-10-CM | POA: Diagnosis not present

## 2018-05-21 DIAGNOSIS — R52 Pain, unspecified: Secondary | ICD-10-CM

## 2018-05-21 DIAGNOSIS — M25562 Pain in left knee: Secondary | ICD-10-CM | POA: Diagnosis not present

## 2018-05-21 DIAGNOSIS — S139XXD Sprain of joints and ligaments of unspecified parts of neck, subsequent encounter: Secondary | ICD-10-CM | POA: Diagnosis not present

## 2018-05-21 DIAGNOSIS — M79675 Pain in left toe(s): Secondary | ICD-10-CM | POA: Diagnosis not present

## 2018-05-21 DIAGNOSIS — M25561 Pain in right knee: Secondary | ICD-10-CM | POA: Diagnosis not present

## 2018-05-21 NOTE — Patient Instructions (Signed)
Ms. Mal AmabileBrock,  I will call with lab results tomorrow.   If labs are normal, it may be worth trying a different form of metformin to see if that helps symptoms.  There was evidence of fatty liver on your previous CT. Losing weight can help. If you would like to see a gastroenterologist about this, please let us know.  Best, Dr. Sampson GoonFitzgerald
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
## 2018-05-21 NOTE — Assessment & Plan Note (Addendum)
-   Differential includes post-cholecystectomy diarrhea, celiac disease (maternal history), chronic pancreatitis (given previous episode after starting lisinopril) and medication side effect (on metformin). IBS less likely as does not have regular abdominal pain and infection less likely due to chronicity and lack of recent travel. - Will obtain tissue transglutaminase IgA, CRP, lipase - Continue prn imodium. Could consider trial of cholestyramine (and would have additional benefit triglyceride effect).  - If labs return as normal, would start by switching metformin to long-acting and consider GI referral, as also has had previous evidence of fatty liver on imaging (though planning on gastric bypass surgery)
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
## 2018-05-21 NOTE — Assessment & Plan Note (Signed)
-   Will repeat ANA for ongoing joint pain concerns and check uric acid to rule-out gout

## 2018-05-21 NOTE — Assessment & Plan Note (Signed)
-   Improving with muscle relaxant and light stretching. To resume work next week given increased neck mobility and decreased frequency of headaches. Counseled that she may want to pre-treat with ibuprofen during initial shifts back.  - Provided handout on neck sprain exercises.

## 2018-05-22 ENCOUNTER — Encounter: Payer: Self-pay | Admitting: Internal Medicine

## 2018-05-25 LAB — CELIAC DISEASE ANTIBODY SCREEN
Antigliadin Abs, IgA: 6 units (ref 0–19)
IGA/IMMUNOGLOBULIN A, SERUM: 127 mg/dL (ref 87–352)
Transglutaminase IgA: <2 U/mL (ref 0–3)

## 2018-05-25 LAB — C-REACTIVE PROTEIN: CRP: 9 mg/L (ref 0–10)

## 2018-05-25 LAB — ANA: Anti Nuclear Antibody(ANA): NEGATIVE

## 2018-05-25 LAB — URIC ACID: URIC ACID: 5.5 mg/dL (ref 2.5–7.1)

## 2018-05-25 LAB — LIPASE: LIPASE: 21 U/L (ref 14–72)

## 2018-06-08 ENCOUNTER — Other Ambulatory Visit: Payer: Self-pay | Admitting: Family Medicine

## 2018-06-08 ENCOUNTER — Other Ambulatory Visit: Payer: Self-pay | Admitting: Internal Medicine

## 2018-06-08 DIAGNOSIS — F411 Generalized anxiety disorder: Secondary | ICD-10-CM

## 2018-06-08 DIAGNOSIS — I10 Essential (primary) hypertension: Secondary | ICD-10-CM

## 2018-06-08 MED FILL — DICLOFENAC SODIUM 1% GEL: 1 | 10 days supply | Qty: 100 | Fill #4

## 2018-06-08 MED FILL — METOPROLOL SUCCINATE ER 200: 200 | 30 days supply | Qty: 30 | Fill #0

## 2018-06-08 MED FILL — ESCITALOPRAM 20 MG TABLET: 20 | 30 days supply | Qty: 30 | Fill #0

## 2018-06-08 MED FILL — raNITIdine HCL 150 MG TABS: 150 | 30 days supply | Qty: 60 | Fill #1

## 2018-06-08 MED FILL — metFORMIN HCL 500 MG TABS: 500 | 30 days supply | Qty: 60 | Fill #2

## 2018-06-08 MED FILL — CYCLOBENZAPRINE 10 MG TAB: 10 | 10 days supply | Qty: 30 | Fill #1

## 2018-06-22 ENCOUNTER — Encounter: Payer: Self-pay | Admitting: Family Medicine

## 2018-06-22 ENCOUNTER — Ambulatory Visit (INDEPENDENT_AMBULATORY_CARE_PROVIDER_SITE_OTHER): Payer: 59 | Admitting: Family Medicine

## 2018-06-22 VITALS — BP 124/80 | HR 84 | Temp 98.3°F | Ht 65.0 in | Wt 312.2 lb

## 2018-06-22 DIAGNOSIS — R195 Other fecal abnormalities: Secondary | ICD-10-CM

## 2018-06-22 MED ORDER — METFORMIN HCL ER (MOD) 1000 MG PO TB24: 1000.0000 mg | ORAL_TABLET | Freq: Every day | ORAL | 3 refills | Status: DC

## 2018-06-22 NOTE — Progress Notes (Signed)
Subjective: Chief Complaint  Patient presents with  . lab results  . excessive bowel movements     HPI: Amy Osborne is a 42 y.o. presenting to clinic today to discuss the following:  Continued Frequent Loose Stools Still having frequent loose stools 4-5 times per day that occur about 30 minutes or less after meals. She will feel an immediate urgency. This has been an ongoing issue for about a year now. She has had no blood in her stools. No pain, no bloating, no changes in diet, does have a history of breaking out in hives from foods.  Patient denies any abdominal pain, vomiting ,or nausea. She has had this issue since before she started taking Metformin. 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  Denies fever, chills, unintended weight loss, recent travel, or any recent adverse reactions to foods. She does endorse joint pain in her hands and knees that improves with Ibuprofen.  Family history positive for RA and Celiac disease (Mother).  Health Maintenance: none today     ROS noted in HPI.   Past Medical, Surgical, Social, and Family History Reviewed & Updated per EMR.   Pertinent Historical Findings include:   Social History   Tobacco Use  Smoking Status Former Smoker  . Types: Cigarettes  . Last attempt to quit: 11/26/2003  . Years since quitting: 14.5  Smokeless Tobacco Never Used    Objective: BP 124/80 (BP Location: Left Arm, Patient Position: Sitting, Cuff Size: Large)   Pulse 84   Temp 98.3 F (36.8 C) (Oral)   Ht 5\' 5"  (1.651 m)   Wt (!) 312 lb 3.2 oz (141.6 kg)   SpO2 96%   BMI 51.95 kg/m  Vitals and nursing notes reviewed  Physical Exam Gen: Alert and Oriented x 3, NAD HEENT: Normocephalic, atraumatic, PERRLA, EOMI CV: RRR, no murmurs, normal S1,  S2 split, +2 pulses dorsalis pedis bilaterally, no JVD Resp: CTAB, no wheezing, rales, or rhonchi, comfortable work of breathing Abd: obese, non-distended, non-tender, soft, +bs in all four quadrants, mild hepatomegaly Ext: no clubbing, cyanosis, or edema Skin: warm, dry, intact, no rashes Psych: appropriate behavior, mood  No results found for this or any previous visit (from the past 72 hour(s)).  Assessment/Plan:  Loose stools Etiology could be multifactorial due to medications, stress, and dietary habits in the setting of post cholecystectomy.  Changing to metformin XR to remove any potential complicating effect this may have with her current symptoms however I doubt this is the root cause. Her symptoms are inconsistent with IBS as she has no abdominal pain, bloating, etc. She also lacks any bloody stools or GI upset often seen in IBD. Autoimmune disease was considered in prior workup and has been negative. Celiac disease work up is also negative for antibodies but may benefit from biopsy.   I have referred her to GI to see if they want to pursue further workup. I did consider ulcerative colitis given her joint pain but I would expect some kind of abdominal discomfort and she has none.    PATIENT EDUCATION PROVIDED: See AVS    Diagnosis and plan along with any newly prescribed medication(s) were discussed in detail with this patient today. The patient verbalized understanding and agreed with the  plan. Patient advised if symptoms worsen return to clinic or ER.   Health Maintainance:   Orders Placed This Encounter  Procedures  . Ambulatory referral to Gastroenterology    Referral Priority:   Routine    Referral Type:   Consultation    Referral Reason:   Specialty Services Required    Number of Visits Requested:   1    Meds ordered this encounter  Medications  . metFORMIN (GLUMETZA) 1000 MG (MOD) 24 hr tablet    Sig: Take 1 tablet (1,000 mg total) by mouth daily with breakfast.     Dispense:  30 tablet    Refill:  3     Tim Karen ChafeLockamy, DO 06/22/2018, 9:12 AM PGY-2 [facility]

## 2018-06-22 NOTE — Assessment & Plan Note (Addendum)
Etiology could be multifactorial due to medications, stress, and dietary habits in the setting of post cholecystectomy.  Changing to metformin XR to remove any potential complicating effect this may have with her current symptoms however I doubt this is the root cause. Her symptoms are inconsistent with IBS as she has no abdominal pain, bloating, etc. She also lacks any bloody stools or GI upset often seen in IBD. Autoimmune disease was considered in prior workup and has been negative. Celiac disease work up is also negative for antibodies but may benefit from biopsy.   I have referred her to GI to see if they want to pursue further workup. I did consider ulcerative colitis given her joint pain but I would expect some kind of abdominal discomfort and she has none.

## 2018-06-22 NOTE — Patient Instructions (Addendum)
It was great to meet you today! Thank you for letting me participate in your care!  Today, we discussed your continued loose and frequent stools. I changed one medication, Metformin from 500mg  twice per day to one 1,000mg  pill per day. This may help your symptoms. I also have made you a referral to Gastroenterology to further evaluate your continued symptoms.   Please be sure to come back and see us after they have seen and evaluated you.  Be well, Amy Osborne Amy Zappia, DO PGY-2, [facility]

## 2018-06-24 ENCOUNTER — Other Ambulatory Visit: Payer: Self-pay | Admitting: Family Medicine

## 2018-06-24 ENCOUNTER — Telehealth: Payer: Self-pay | Admitting: *Deleted

## 2018-06-24 MED ORDER — METFORMIN HCL ER 500 MG PO TB24: 500.0000 mg | ORAL_TABLET | Freq: Every day | ORAL | 3 refills | Status: DC

## 2018-06-24 NOTE — Telephone Encounter (Signed)
Glumetza changed to Metformin ER.  Thanks.

## 2018-06-24 NOTE — Telephone Encounter (Signed)
Received fax from [facility].  request to change Glumetza to Metformin ER 500mg  every am with breakfast.  Please change and resend if appropriate.  Fleeger, Maryjo RochesterJessica Dawn, CMA

## 2018-07-17 MED FILL — METOPROLOL SUCCINATE ER 200: 200 | 30 days supply | Qty: 30 | Fill #1

## 2018-07-17 MED FILL — ESCITALOPRAM 20 MG TABLET: 20 | 30 days supply | Qty: 30 | Fill #1

## 2018-07-17 MED FILL — METFORMIN HCL ER 500 MG TAB: 500 | 30 days supply | Qty: 30 | Fill #0

## 2018-08-19 ENCOUNTER — Telehealth: Payer: 59 | Admitting: Family

## 2018-08-19 DIAGNOSIS — B9789 Other viral agents as the cause of diseases classified elsewhere: Secondary | ICD-10-CM | POA: Diagnosis not present

## 2018-08-19 DIAGNOSIS — J329 Chronic sinusitis, unspecified: Secondary | ICD-10-CM | POA: Diagnosis not present

## 2018-08-19 MED ORDER — FLUTICASONE PROPIONATE 50 MCG/ACT NA SUSP: 2.0000 | Freq: Every day | NASAL | 6 refills | Status: DC

## 2018-08-19 MED FILL — FLUTICASONE PROP 50 MCG SPR: 50 | 30 days supply | Qty: 16 | Fill #0

## 2018-08-19 NOTE — Progress Notes (Signed)
We are sorry that you are not feeling well.  Here is how we plan to help!  Based on what you have shared with me it looks like you have sinusitis.  Sinusitis is inflammation and infection in the sinus cavities of the head.  Based on your presentation I believe you most likely have Acute Viral Sinusitis.This is an infection most likely caused by a virus. There is not specific treatment for viral sinusitis other than to help you with the symptoms until the infection runs its course.  You may use an oral decongestant such as Mucinex D or if you have glaucoma or high blood pressure use plain Mucinex. Saline nasal spray help and can safely be used as often as needed for congestion, I have prescribed: Fluticasone nasal spray two sprays in each nostril once a day.  Providers prescribe antibiotics to treat infections caused by bacteria. Antibiotics are very powerful in treating bacterial infections when they are used properly. To maintain their effectiveness, they should be used only when necessary. Overuse of antibiotics has resulted in the development of superbugs that are resistant to treatment!    After careful review of your answers, I would not recommend an antibiotic for your condition.  Antibiotics are not effective against viruses and therefore should not be used to treat them. Common examples of infections caused by viruses include colds and flu.  Some authorities believe that zinc sprays or the use of Echinacea may shorten the course of your symptoms.  Sinus infections are not as easily transmitted as other respiratory infection, however we still recommend that you avoid close contact with loved ones, especially the very young and elderly.  Remember to wash your hands thoroughly throughout the day as this is the number one way to prevent the spread of infection!  Home Care:  Only take medications as instructed by your medical team.  Do not take these medications with alcohol.  A steam or  ultrasonic humidifier can help congestion.  You can place a towel over your head and breathe in the steam from hot water coming from a faucet.  Avoid close contacts especially the very young and the elderly.  Cover your mouth when you cough or sneeze.  Always remember to wash your hands.  Get Help Right Away If:  You develop worsening fever or sinus pain.  You develop a severe head ache or visual changes.  Your symptoms persist after you have completed your treatment plan.  Make sure you  Understand these instructions.  Will watch your condition.  Will get help right away if you are not doing well or get worse.  Your e-visit answers were reviewed by a board certified advanced clinical practitioner to complete your personal care plan.  Depending on the condition, your plan could have included both over the counter or prescription medications.  If there is a problem please reply  once you have received a response from your provider.  Your safety is important to us.  If you have drug allergies check your prescription carefully.    You can use MyChart to ask questions about today's visit, request a non-urgent call back, or ask for a work or school excuse for 24 hours related to this e-Visit. If it has been greater than 24 hours you will need to follow up with your provider, or enter a new e-Visit to address those concerns.  You will get an e-mail in the next two days asking about your experience.  I hope that your   e-visit has been valuable and will speed your recovery. Thank you for using e-visits.     

## 2018-08-21 ENCOUNTER — Other Ambulatory Visit: Payer: Self-pay | Admitting: Internal Medicine

## 2018-08-21 DIAGNOSIS — L508 Other urticaria: Secondary | ICD-10-CM

## 2018-08-21 MED FILL — raNITIdine HCL 150 MG TABS: 150 | 30 days supply | Qty: 60 | Fill #2

## 2018-08-21 MED FILL — METOPROLOL SUCCINATE ER 200: 200 | 30 days supply | Qty: 30 | Fill #2

## 2018-08-21 MED FILL — METFORMIN HCL ER 500 MG TAB: 500 | 30 days supply | Qty: 30 | Fill #1

## 2018-08-21 MED FILL — ATORVASTATIN 40 MG TABLET: 40 | 90 days supply | Qty: 90 | Fill #0

## 2018-08-21 MED FILL — ESCITALOPRAM 20 MG TABLET: 20 | 30 days supply | Qty: 30 | Fill #2

## 2018-09-08 ENCOUNTER — Encounter: Payer: Self-pay | Admitting: Family Medicine

## 2018-09-28 ENCOUNTER — Other Ambulatory Visit: Payer: Self-pay | Admitting: Family Medicine

## 2018-09-28 DIAGNOSIS — F411 Generalized anxiety disorder: Secondary | ICD-10-CM

## 2018-09-28 MED FILL — ESCITALOPRAM 20 MG TABLET: 20 | 30 days supply | Qty: 30 | Fill #0

## 2018-09-28 MED FILL — raNITIdine HCL 150 MG TABS: 150 | 30 days supply | Qty: 60 | Fill #3

## 2018-09-28 MED FILL — METOPROLOL SUCCINATE ER 200: 200 | 30 days supply | Qty: 30 | Fill #3

## 2018-09-28 MED FILL — metFORMIN HCL ER 500 MG TB2: 500 | 30 days supply | Qty: 30 | Fill #2

## 2018-10-01 DIAGNOSIS — H43812 Vitreous degeneration, left eye: Secondary | ICD-10-CM | POA: Diagnosis not present

## 2018-10-01 DIAGNOSIS — E119 Type 2 diabetes mellitus without complications: Secondary | ICD-10-CM | POA: Diagnosis not present

## 2018-10-01 LAB — HM DIABETES EYE EXAM

## 2018-10-20 ENCOUNTER — Encounter: Payer: Self-pay | Admitting: Family Medicine

## 2018-11-13 MED FILL — METOPROLOL SUCCINATE ER 200: 200 | 30 days supply | Qty: 30 | Fill #4

## 2018-11-13 MED FILL — ESCITALOPRAM 20 MG TABLET: 20 | 30 days supply | Qty: 30 | Fill #1

## 2018-11-13 MED FILL — metFORMIN HCL ER 500 MG TB2: 500 | 30 days supply | Qty: 30 | Fill #3

## 2018-11-19 ENCOUNTER — Other Ambulatory Visit: Payer: Self-pay | Admitting: Internal Medicine

## 2018-11-19 DIAGNOSIS — L508 Other urticaria: Secondary | ICD-10-CM

## 2018-11-20 ENCOUNTER — Telehealth: Payer: Self-pay

## 2018-11-20 ENCOUNTER — Other Ambulatory Visit: Payer: Self-pay | Admitting: Family Medicine

## 2018-11-20 DIAGNOSIS — L508 Other urticaria: Secondary | ICD-10-CM

## 2018-11-20 DIAGNOSIS — K219 Gastro-esophageal reflux disease without esophagitis: Secondary | ICD-10-CM

## 2018-11-20 MED ORDER — FAMOTIDINE 10 MG PO TABS: 10.0000 mg | ORAL_TABLET | Freq: Two times a day (BID) | ORAL | Status: DC

## 2018-11-20 MED ORDER — RANITIDINE HCL 150 MG PO TABS: 150.0000 mg | ORAL_TABLET | Freq: Two times a day (BID) | ORAL | 3 refills | Status: DC

## 2018-11-20 NOTE — Progress Notes (Signed)
Refill requested via pharmacy

## 2018-11-20 NOTE — Progress Notes (Signed)
Ranitidine is on backorder, ordering famotidine to replace it

## 2018-11-20 NOTE — Telephone Encounter (Signed)
Pharmacy is asking for a replacement for Ranitidine as it is on backorder. Ples SpecterAlisa Brake, RN [facility] RN)

## 2018-11-24 ENCOUNTER — Other Ambulatory Visit: Payer: Self-pay | Admitting: Family Medicine

## 2018-11-24 ENCOUNTER — Other Ambulatory Visit: Payer: Self-pay | Admitting: Internal Medicine

## 2018-11-24 DIAGNOSIS — L508 Other urticaria: Secondary | ICD-10-CM

## 2018-11-26 MED FILL — ATORVASTATIN 40 MG TABLET: 40 | 90 days supply | Qty: 90 | Fill #0

## 2018-11-26 NOTE — Telephone Encounter (Signed)
Informed patient of the change in medication.  Glennie Hawk, CMA

## 2018-11-30 ENCOUNTER — Telehealth: Payer: Self-pay

## 2018-11-30 NOTE — Telephone Encounter (Signed)
Nita Sells from [facility] called. Patient needs an alternative for the Ranitidine that was recalled. States Pepcid now on backorder. Suggests sending in a PPI instead of an H2 blocker.  Call back is [phone]  Ples Specter, RN [facility] RN)

## 2018-12-01 ENCOUNTER — Other Ambulatory Visit: Payer: Self-pay | Admitting: Family Medicine

## 2018-12-01 MED ORDER — OMEPRAZOLE 20 MG PO CPDR: 20.0000 mg | DELAYED_RELEASE_CAPSULE | Freq: Every day | ORAL | 3 refills | Status: DC

## 2018-12-01 MED FILL — OMEPRAZOLE 20 MG CPDR: 20 | 30 days supply | Qty: 30 | Fill #0

## 2018-12-01 NOTE — Progress Notes (Signed)
Sending PPI as H2 blockers are on backorder

## 2018-12-22 ENCOUNTER — Other Ambulatory Visit: Payer: Self-pay | Admitting: Family Medicine

## 2018-12-22 MED FILL — METOPROLOL SUCCINATE ER 200: 200 | 30 days supply | Qty: 30 | Fill #5

## 2018-12-22 MED FILL — metFORMIN HCL ER 500 MG TB2: 500 | 30 days supply | Qty: 30 | Fill #0

## 2018-12-22 MED FILL — ESCITALOPRAM 20 MG TABLET: 20 | 30 days supply | Qty: 30 | Fill #2

## 2019-01-05 MED FILL — OMEPRAZOLE 20 MG CPDR: 20 | 30 days supply | Qty: 30 | Fill #1 | Status: TO

## 2019-01-29 ENCOUNTER — Ambulatory Visit (INDEPENDENT_AMBULATORY_CARE_PROVIDER_SITE_OTHER): Payer: Self-pay | Admitting: Physician Assistant

## 2019-01-29 VITALS — BP 120/90 | HR 76 | Temp 98.2°F | Resp 14 | Wt 312.0 lb

## 2019-01-29 DIAGNOSIS — R6889 Other general symptoms and signs: Secondary | ICD-10-CM

## 2019-01-29 DIAGNOSIS — J0100 Acute maxillary sinusitis, unspecified: Secondary | ICD-10-CM

## 2019-01-29 LAB — POCT INFLUENZA A/B
Influenza A, POC: NEGATIVE
Influenza B, POC: NEGATIVE

## 2019-01-29 MED ORDER — FLUTICASONE PROPIONATE 50 MCG/ACT NA SUSP: 2.0000 | Freq: Every day | NASAL | 0 refills | Status: DC

## 2019-01-29 MED ORDER — SALINE SPRAY 0.65 % NA SOLN: 1.0000 | NASAL | 0 refills | Status: DC | PRN

## 2019-01-29 MED ORDER — AMOXICILLIN-POT CLAVULANATE 875-125 MG PO TABS: 1.0000 | ORAL_TABLET | Freq: Two times a day (BID) | ORAL | 0 refills | Status: AC

## 2019-01-29 MED FILL — FLUTICASONE PROP 50 MCG SPR: 50 | 30 days supply | Qty: 16 | Fill #0

## 2019-01-29 MED FILL — AMOX-CLAV 875-125 MG TABLET: 875-125 | 7 days supply | Qty: 14 | Fill #0

## 2019-01-29 NOTE — Patient Instructions (Addendum)
Sinusitis, Adult  Complete antibiotic course.  Use Flonase and nasal saline for congestion.  May also use over-the-counter DayQuil you can tolerate it.  If this elevates your blood pressure, discontinue and use over-the-counter Coricidin cough and cold.  If no improvement in 7 to 10 days with treatment plan, please seek care at family doctor.  If any of your symptoms worsen or you develop new concerning symptoms, please seek care sooner.   Sinusitis is soreness and swelling (inflammation) of your sinuses. Sinuses are hollow spaces in the bones around your face. They are located:  Around your eyes.  In the middle of your forehead.  Behind your nose.  In your cheekbones. Your sinuses and nasal passages are lined with a fluid called mucus. Mucus drains out of your sinuses. Swelling can trap mucus in your sinuses. This lets germs (bacteria, virus, or fungus) grow, which leads to infection. Most of the time, this condition is caused by a virus. What are the causes? This condition is caused by:  Allergies.  Asthma.  Germs.  Things that block your nose or sinuses.  Growths in the nose (nasal polyps).  Chemicals or irritants in the air.  Fungus (rare). What increases the risk? You are more likely to develop this condition if:  You have a weak body defense system (immune system).  You do a lot of swimming or diving.  You use nasal sprays too much.  You smoke. What are the signs or symptoms? The main symptoms of this condition are pain and a feeling of pressure around the sinuses. Other symptoms include:  Stuffy nose (congestion).  Runny nose (drainage).  Swelling and warmth in the sinuses.  Headache.  Toothache.  A cough that may get worse at night.  Mucus that collects in the throat or the back of the nose (postnasal drip).  Being unable to smell and taste.  Being very tired (fatigue).  A fever.  Sore throat.  Bad breath. How is this diagnosed? This  condition is diagnosed based on:  Your symptoms.  Your medical history.  A physical exam.  Tests to find out if your condition is short-term (acute) or long-term (chronic). Your doctor may: ? Check your nose for growths (polyps). ? Check your sinuses using a tool that has a light (endoscope). ? Check for allergies or germs. ? Do imaging tests, such as an MRI or CT scan. How is this treated? Treatment for this condition depends on the cause and whether it is short-term or long-term.  If caused by a virus, your symptoms should go away on their own within 10 days. You may be given medicines to relieve symptoms. They include: ? Medicines that shrink swollen tissue in the nose. ? Medicines that treat allergies (antihistamines). ? A spray that treats swelling of the nostrils. ? Rinses that help get rid of thick mucus in your nose (nasal saline washes).  If caused by bacteria, your doctor may wait to see if you will get better without treatment. You may be given antibiotic medicine if you have: ? A very bad infection. ? A weak body defense system.  If caused by growths in the nose, you may need to have surgery. Follow these instructions at home: Medicines  Take, use, or apply over-the-counter and prescription medicines only as told by your doctor. These may include nasal sprays.  If you were prescribed an antibiotic medicine, take it as told by your doctor. Do not stop taking the antibiotic even if you start to  feel better. Hydrate and humidify   Drink enough water to keep your pee (urine) pale yellow.  Use a cool mist humidifier to keep the humidity level in your home above 50%.  Breathe in steam for 10-15 minutes, 3-4 times a day, or as told by your doctor. You can do this in the bathroom while a hot shower is running.  Try not to spend time in cool or dry air. Rest  Rest as much as you can.  Sleep with your head raised (elevated).  Make sure you get enough sleep each  night. General instructions   Put a warm, moist washcloth on your face 3-4 times a day, or as often as told by your doctor. This will help with discomfort.  Wash your hands often with soap and water. If there is no soap and water, use hand sanitizer.  Do not smoke. Avoid being around people who are smoking (secondhand smoke).  Keep all follow-up visits as told by your doctor. This is important. Contact a doctor if:  You have a fever.  Your symptoms get worse.  Your symptoms do not get better within 10 days. Get help right away if:  You have a very bad headache.  You cannot stop throwing up (vomiting).  You have very bad pain or swelling around your face or eyes.  You have trouble seeing.  You feel confused.  Your neck is stiff.  You have trouble breathing. Summary  Sinusitis is swelling of your sinuses. Sinuses are hollow spaces in the bones around your face.  This condition is caused by tissues in your nose that become inflamed or swollen. This traps germs. These can lead to infection.  If you were prescribed an antibiotic medicine, take it as told by your doctor. Do not stop taking it even if you start to feel better.  Keep all follow-up visits as told by your doctor. This is important. This information is not intended to replace advice given to you by your health care provider. Make sure you discuss any questions you have with your health care provider. Document Released: 04/29/2008 Document Revised: 04/13/2018 Document Reviewed: 04/13/2018 Elsevier Interactive Patient Education  2019 [employer].

## 2019-01-29 NOTE — Progress Notes (Signed)
MRN: [id number] DOB: [birth year]  Subjective:   Amy Osborne is a 43 y.o. female presenting for chief complaint of cough, drainage, ears popping and sinus pressure (x3days yellow/green mucous (dayquil/nyquil)) .  Reports 1.5 wk history of illness.  Started out with flu like symptoms-had a fever, congestion, dry cough, sore throat, and headache.  Felt really bad for 2 to 3 days but then started improving.  3 days ago sinus pressure and congestion returned and is worse than it initially was.  Can feel the drainage go from one side to the other. Has associated ear fullness, greenish mucus drainage, and overall fatigue.  Cough has improved.  Denies new fever, productive cough,  sore throat, inability to swallow, voice change, wheezing, shortness of breath and chest pain, nausea, vomiting and abdominal pain. Has tried over-the-counter ibuprofen, Tylenol, DayQuil, NyQuil, Chloraseptic spray, Mucinex, and antihistamine with no full relief.  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 Patient has had flu shot this season. Denies smoking.  PMH of controlled diabetes: Last A1c 5.9; hypertension, and hyperlipidemia.  Denies history of heart disease, asthma, and autoimmune disorder.  Denies any other aggravating or relieving factors, no other questions or concerns.  Review of Systems  Constitutional: Negative for diaphoresis.  Eyes: Negative for blurred vision, double vision and photophobia.  Respiratory: Negative for hemoptysis.   Cardiovascular: Negative for palpitations.  Skin: Negative for rash.  Neurological: Negative for dizziness, focal weakness and weakness.    Amy Osborne has a current medication list which includes the following prescription(s): all day allergy, atorvastatin, escitalopram, metformin, metoprolol, omeprazole, amoxicillin-clavulanate, cyclobenzaprine, diclofenac sodium, diclofenac sodium, fluticasone, and sodium chloride, and the following Facility-Administered  Medications: famotidine. Also is allergic to amlodipine and lisinopril.  Amy Osborne  has a past medical history of Anxiety, Diabetes mellitus without complication (HCC), GERD (gastroesophageal reflux disease), Hypertension, and Pancreatitis. Also  has a past surgical history that includes Cholecystectomy.   Objective:   Vitals: BP 120/90   Pulse 76   Temp 98.2 F (36.8 C)   Resp 14   Wt (!) 312 lb (141.5 kg)   SpO2 97%   BMI 51.92 kg/m   Physical Exam Vitals signs reviewed.  Constitutional:      General: She is not in acute distress.    Appearance: She is well-developed. She is not ill-appearing or toxic-appearing.  HENT:     Head: Normocephalic and atraumatic.     Right Ear: Ear canal and external ear normal. A middle ear effusion is present. Tympanic membrane is not erythematous or bulging.     Left Ear: Ear canal and external ear normal. A middle ear effusion is present. Tympanic membrane is not erythematous or bulging.     Nose: Mucosal edema (R>L) and congestion present.     Right Sinus: Maxillary sinus tenderness present. No frontal sinus tenderness.     Left Sinus: No maxillary sinus tenderness or frontal sinus tenderness.     Mouth/Throat:     Lips: Pink.     Mouth: Mucous membranes are moist.     Pharynx: Oropharynx is clear. Uvula midline. No posterior oropharyngeal erythema.     Tonsils: No tonsillar exudate or tonsillar abscesses. Swelling: 1+ on the right. 1+ on the left.  Eyes:     Extraocular Movements: Extraocular movements intact.     Conjunctiva/sclera: Conjunctivae normal.     Pupils: Pupils are equal, round, and reactive to light.  Neck:     Musculoskeletal:  Normal range of motion.  Cardiovascular:     Rate and Rhythm: Normal rate and regular rhythm.     Heart sounds: Normal heart sounds.  Pulmonary:     Effort: Pulmonary effort is normal.     Breath sounds: Normal breath sounds. No decreased breath sounds, wheezing, rhonchi or rales.  Lymphadenopathy:      Head:     Right side of head: No submental, submandibular, tonsillar, preauricular, posterior auricular or occipital adenopathy.     Left side of head: No submental, submandibular, tonsillar, preauricular, posterior auricular or occipital adenopathy.     Cervical: No cervical adenopathy.     Upper Body:     Right upper body: No supraclavicular adenopathy.     Left upper body: No supraclavicular adenopathy.  Skin:    General: Skin is warm and dry.  Neurological:     Mental Status: She is alert.     Results for orders placed or performed in visit on 01/29/19 (from the past 24 hour(s))  POCT Influenza A/B     Status: None   Collection Time: 01/29/19 10:58 AM  Result Value Ref Range   Influenza A, POC Negative Negative   Influenza B, POC Negative Negative    Assessment and Plan :  1. Acute maxillary sinusitis, recurrence not specified Patient is overall well-appearing, no acute distress.  VSS. +TTP of right sinus.  Lungs CTAB. Point-of-care flu test negative.  History is concerning for secondary sickening sinusitis from initial influenza like illness. Rec oral abx and symptomatic tx at this time. .  Recommend following up with family doctor or local urgent care if no improvement in 7 to 10 days with treatment plan.  Encouraged to seek care sooner at urgent care or ED if any of his current symptoms worsen or he develops new concerning symptoms.  Patient voices understanding. - amoxicillin-clavulanate (AUGMENTIN) 875-125 MG tablet; Take 1 tablet by mouth 2 (two) times daily for 7 days.  Dispense: 14 tablet; Refill: 0 - fluticasone (FLONASE) 50 MCG/ACT nasal spray; Place 2 sprays into both nostrils daily.  Dispense: 16 g; Refill: 0 - sodium chloride (OCEAN) 0.65 % SOLN nasal spray; Place 1 spray into both nostrils as needed.  Dispense: 60 mL; Refill: 0  2. Flu-like symptoms - POCT Influenza A/B  Benjiman Core, PA-C  [facility] 01/29/2019 11:32 AM

## 2019-02-02 ENCOUNTER — Other Ambulatory Visit: Payer: Self-pay | Admitting: Family Medicine

## 2019-02-02 DIAGNOSIS — F411 Generalized anxiety disorder: Secondary | ICD-10-CM

## 2019-02-02 DIAGNOSIS — I10 Essential (primary) hypertension: Secondary | ICD-10-CM

## 2019-02-02 MED FILL — metFORMIN HCL ER 500 MG TB2: 500 | 30 days supply | Qty: 30 | Fill #1 | Status: TO

## 2019-02-02 MED FILL — METOPROLOL SUCCINATE ER 200: 200 | 30 days supply | Qty: 30 | Fill #0 | Status: TO

## 2019-02-02 MED FILL — ESCITALOPRAM 20 MG TABLET: 20 | 30 days supply | Qty: 30 | Fill #0 | Status: TO

## 2019-02-09 ENCOUNTER — Telehealth: Payer: 59 | Admitting: Physician Assistant

## 2019-02-09 DIAGNOSIS — N760 Acute vaginitis: Secondary | ICD-10-CM | POA: Diagnosis not present

## 2019-02-09 MED ORDER — FLUCONAZOLE 150 MG PO TABS: 150.0000 mg | ORAL_TABLET | Freq: Once | ORAL | 0 refills | Status: AC

## 2019-02-09 MED FILL — FLUCONAZOLE 150 MG TABS: 150 | 1 days supply | Qty: 1 | Fill #0

## 2019-02-09 NOTE — Progress Notes (Signed)
We are sorry that you are not feeling well. Here is how we plan to help! Based on what you shared with me it looks like you: May have a yeast vaginosis   Vaginosis is an inflammation of the vagina that can result in discharge, itching and pain. The cause is usually a change in the normal balance of vaginal bacteria or an infection. Vaginosis can also result from reduced estrogen levels after menopause.  The most common causes of vaginosis are:   Bacterial vaginosis which results from an overgrowth of one on several organisms that are normally present in your vagina.   Yeast infections which are caused by a naturally occurring fungus called candida.   Vaginal atrophy (atrophic vaginosis) which results from the thinning of the vagina from reduced estrogen levels after menopause.   Trichomoniasis which is caused by a parasite and is commonly transmitted by sexual intercourse.  Factors that increase your risk of developing vaginosis include: Medications, such as antibiotics and steroids Uncontrolled diabetes Use of hygiene products such as bubble bath, vaginal spray or vaginal deodorant Douching Wearing damp or tight-fitting clothing Using an intrauterine device (IUD) for birth control Hormonal changes, such as those associated with pregnancy, birth control pills or menopause Sexual activity Having a sexually transmitted infection  Your treatment plan is A single Diflucan (fluconazole) 159m tablet once.  I have electronically sent this prescription into the pharmacy that you have chosen.  Be sure to take all of the medication as directed. Stop taking any medication if you develop a rash, tongue swelling or shortness of breath. Mothers who are breast feeding should consider pumping and discarding their breast milk while on these antibiotics. However, there is no consensus that infant exposure at these doses would be harmful.  Remember that medication creams can weaken latex  condoms. .Marland Kitchen  HOME CARE:  Good hygiene may prevent some types of vaginosis from recurring and may relieve some symptoms:  Avoid baths, hot tubs and whirlpool spas. Rinse soap from your outer genital area after a shower, and dry the area well to prevent irritation. Don't use scented or harsh soaps, such as those with deodorant or antibacterial action. Avoid irritants. These include scented tampons and pads. Wipe from front to back after using the toilet. Doing so avoids spreading fecal bacteria to your vagina.  Other things that may help prevent vaginosis include:  Don't douche. Your vagina doesn't require cleansing other than normal bathing. Repetitive douching disrupts the normal organisms that reside in the vagina and can actually increase your risk of vaginal infection. Douching won't clear up a vaginal infection. Use a latex condom. Both female and female latex condoms may help you avoid infections spread by sexual contact. Wear cotton underwear. Also wear pantyhose with a cotton crotch. If you feel comfortable without it, skip wearing underwear to bed. Yeast thrives in mCampbell SoupYour symptoms should improve in the next day or two.  GET HELP RIGHT AWAY IF:  You have pain in your lower abdomen ( pelvic area or over your ovaries) You develop nausea or vomiting You develop a fever Your discharge changes or worsens You have persistent pain with intercourse You develop shortness of breath, a rapid pulse, or you faint.  These symptoms could be signs of problems or infections that need to be evaluated by a medical provider now.  MAKE SURE YOU   Understand these instructions. Will watch your condition. Will get help right away if you are not doing well or get  worse.  Your e-visit answers were reviewed by a board certified advanced clinical practitioner to complete your personal care plan. Depending upon the condition, your plan could have included both over the counter or  prescription medications. Please review your pharmacy choice to make sure that you have choses a pharmacy that is open for you to pick up any needed prescription, Your safety is important to [place]. If you have drug allergies check your prescription carefully.   You can use MyChart to ask questions about today's visit, request a non-urgent call back, or ask for a work or school excuse for 24 hours related to this e-Visit. If it has been greater than 24 hours you will need to follow up with your provider, or enter a new e-Visit to address those concerns. You will get a MyChart message within the next two days asking about your experience. I hope that your e-visit has been valuable and will speed your recovery.  ===View-only below this line===   ----- Message -----    From: Doyne Keel    Sent: 02/09/2019  1:21 PM EDT      To: E-Visit Mailing List Subject: E-Visit Submission: Vaginal Symptoms  E-Visit Submission: Vaginal Symptoms --------------------------------  Question: Which of the following are you experiencing? Answer:   Vaginal itching  Question: Are you having pain while passing urine? Answer:   No, I have no pain while urinating  Question: Which of the following applies to your vaginal discharge Answer:   I have a white/milky discharge  Question: Are you pregnant? Answer:   I am confident that I am not pregnant  Question: Are you breastfeeding? Answer:   No  Question: Which of the following are you experiencing? Answer:   None of the above  Question: Do you have any sores on your genitals? Answer:   No  Question: Have you taken antibiotics recently? Answer:   Yes, I recently took some  Question: Please enter when you took antibiotics, and the name of the medicine, if you know it. Answer:   I took Augmentin around 3/6 for one week.  Question: Do you do any of the following? Answer:   None of the above  Question: Which of the following applies to your menstrual  period? Answer:   I have not had a menstrual period for a while  Question: Have you had similar symptoms in the past? Answer:   Yes, I have had similar symptoms before  Question: When you had similar symptoms in  the past, did any of the following work? Answer:   Pills for yeast infection            Cream for yeast infection  Question: Do you have a fever? Answer:   No, I do not have a fever  Question: During the past 2 months, have you had sexual contact with a specific person for the first time? Answer:   No  Question: Has a person with whom you have had sexual contact been recently told they have a disease possibly acquired through sex? Answer:   No  Question: Please list your medication allergies that you may have ? (If 'none' , please list as 'none') Answer:   Allergic to Lisinopril and Amlodipine  Question: Please list any additional comments  Answer:     A total of 5-10 minutes was spent evaluating this patients questionnaire and formulating a plan of care.

## 2019-02-26 ENCOUNTER — Ambulatory Visit (INDEPENDENT_AMBULATORY_CARE_PROVIDER_SITE_OTHER): Payer: Self-pay | Admitting: Family Medicine

## 2019-02-26 ENCOUNTER — Other Ambulatory Visit: Payer: Self-pay

## 2019-02-26 ENCOUNTER — Other Ambulatory Visit: Payer: Self-pay | Admitting: Internal Medicine

## 2019-02-26 VITALS — BP 152/92 | HR 77 | Temp 98.3°F | Wt 312.4 lb

## 2019-02-26 DIAGNOSIS — J029 Acute pharyngitis, unspecified: Secondary | ICD-10-CM

## 2019-02-26 LAB — POCT RAPID STREP A (OFFICE): Rapid Strep A Screen: NEGATIVE

## 2019-02-26 MED FILL — OMEPRAZOLE 20 MG CPDR: 20 | 30 days supply | Qty: 30 | Fill #0
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
## 2019-02-26 NOTE — Patient Instructions (Signed)
It was great to see you!  Our plans for today:  -  - Once the coronavirus epidemic has calmed down, be sure to schedule an appointment to see your primary doctor for your diabetes and your pap smear.  We are checking some labs today, we will call you or send you a letter if they are abnormal.   Take care and seek immediate care sooner if you develop any concerns.   Dr. [facility]

## 2019-02-26 NOTE — Assessment & Plan Note (Addendum)
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   Patient referred to [facility] clinic for risk stratification assessment.  I contacted [facility] to give them pertinent clinical picture of case.   Addendum 02/26/19 1530 hrs [name] only advised patient that they do not test for CoViD and that she should go home and report back to them on Monday.  Given this lack of risk stratification assistance, it is my opinion that Amy Osborne has adequate risk of CoViD transmission to vulnerable populations, that she self-quarrentin for 7 days or 3 days beyond resolution of illness symptoms.  A wolrk letter to this effect is available to patient thru MyChart.

## 2019-03-05 MED FILL — DICLOFENAC SODIUM 1 % GEL: 1 | 6 days supply | Qty: 100 | Fill #0

## 2019-03-26 ENCOUNTER — Other Ambulatory Visit: Payer: Self-pay | Admitting: Family Medicine

## 2019-03-26 MED FILL — ESCITALOPRAM 20 MG TABLET: 20 | 30 days supply | Qty: 30 | Fill #0

## 2019-03-26 MED FILL — ATORVASTATIN 40 MG TABLET: 40 | 90 days supply | Qty: 90 | Fill #0

## 2019-03-26 MED FILL — METOPROLOL SUCCINATE ER 200: 200 | 30 days supply | Qty: 30 | Fill #0

## 2019-03-26 MED FILL — OMEPRAZOLE 20 MG CPDR: 20 | 30 days supply | Qty: 30 | Fill #1

## 2019-03-26 MED FILL — metFORMIN HCL ER 500 MG TB2: 500 | 30 days supply | Qty: 30 | Fill #0

## 2019-05-14 ENCOUNTER — Other Ambulatory Visit: Payer: Self-pay | Admitting: Family Medicine

## 2019-05-14 MED FILL — metFORMIN HCL ER 500 MG TB2: 500 | 30 days supply | Qty: 30 | Fill #0

## 2019-05-14 MED FILL — ESCITALOPRAM 20 MG TABLET: 20 | 30 days supply | Qty: 30 | Fill #0

## 2019-05-14 MED FILL — METOPROLOL SUCCINATE ER 200: 200 | 30 days supply | Qty: 30 | Fill #0

## 2019-05-19 MED FILL — OMEPRAZOLE 20 MG CPDR: 20 | 30 days supply | Qty: 30 | Fill #0

## 2019-06-10 ENCOUNTER — Other Ambulatory Visit: Payer: Self-pay | Admitting: Family Medicine

## 2019-06-10 DIAGNOSIS — F411 Generalized anxiety disorder: Secondary | ICD-10-CM

## 2019-06-10 MED FILL — ESCITALOPRAM 20 MG TABLET: 20 | 30 days supply | Qty: 30 | Fill #0

## 2019-06-10 MED FILL — OMEPRAZOLE 20 MG CPDR: 20 | 30 days supply | Qty: 30 | Fill #0

## 2019-06-10 MED FILL — METOPROLOL SUCCINATE ER 200: 200 | 30 days supply | Qty: 30 | Fill #1

## 2019-06-14 ENCOUNTER — Ambulatory Visit (INDEPENDENT_AMBULATORY_CARE_PROVIDER_SITE_OTHER): Payer: Self-pay | Admitting: Family Medicine

## 2019-06-14 ENCOUNTER — Other Ambulatory Visit: Payer: Self-pay

## 2019-06-14 VITALS — BP 101/60 | HR 61

## 2019-06-14 DIAGNOSIS — E1165 Type 2 diabetes mellitus with hyperglycemia: Secondary | ICD-10-CM

## 2019-06-14 DIAGNOSIS — L02419 Cutaneous abscess of limb, unspecified: Secondary | ICD-10-CM

## 2019-06-14 DIAGNOSIS — E785 Hyperlipidemia, unspecified: Secondary | ICD-10-CM

## 2019-06-14 DIAGNOSIS — E1169 Type 2 diabetes mellitus with other specified complication: Secondary | ICD-10-CM

## 2019-06-14 DIAGNOSIS — L0291 Cutaneous abscess, unspecified: Secondary | ICD-10-CM

## 2019-06-14 LAB — POCT GLYCOSYLATED HEMOGLOBIN (HGB A1C): Hemoglobin A1C: 10.5 % — AB (ref 4.0–5.6)

## 2019-06-14 MED ORDER — DOXYCYCLINE HYCLATE 100 MG PO TABS: 100.0000 mg | ORAL_TABLET | Freq: Two times a day (BID) | ORAL | 0 refills | Status: DC

## 2019-06-14 MED FILL — DOXYCYCLINE HYCLATE 100 MG: 100 | 7 days supply | Qty: 14 | Fill #0

## 2019-06-14 NOTE — Patient Instructions (Signed)
It was great meeting you today!  We performed an incision and drainage for a left axillary abscess.  Able to get a lot of pus out of this.  Gave you some supplies for basic wound care.  Take the dressing off to shower today, but otherwise we will keep it on for 24 hours.  Gave you an antibiotic called doxycycline.  Take this for 7 days 2 times per day.  We will get some lab work to check why you are having more frequent abscesses.  This will include a CMP, lipid panel, CBC, hemoglobin A1c

## 2019-06-14 NOTE — Progress Notes (Signed)
105

## 2019-06-15 LAB — COMPREHENSIVE METABOLIC PANEL
ALT: 32 IU/L (ref 0–32)
AST: 19 IU/L (ref 0–40)
Albumin/Globulin Ratio: 1.9 (ref 1.2–2.2)
Albumin: 4.2 g/dL (ref 3.8–4.8)
Alkaline Phosphatase: 123 IU/L — ABNORMAL HIGH (ref 39–117)
BUN/Creatinine Ratio: 11 (ref 9–23)
BUN: 8 mg/dL (ref 6–24)
Bilirubin Total: 0.5 mg/dL (ref 0.0–1.2)
CO2: 23 mmol/L (ref 20–29)
Calcium: 8.9 mg/dL (ref 8.7–10.2)
Chloride: 97 mmol/L (ref 96–106)
Creatinine, Ser: 0.75 mg/dL (ref 0.57–1.00)
GFR calc Af Amer: 112 mL/min/{1.73_m2} (ref >59)
GFR calc non Af Amer: 97 mL/min/{1.73_m2} (ref >59)
Globulin, Total: 2.2 g/dL (ref 1.5–4.5)
Glucose: 315 mg/dL — ABNORMAL HIGH (ref 65–99)
Potassium: 4.3 mmol/L (ref 3.5–5.2)
Sodium: 136 mmol/L (ref 134–144)
Total Protein: 6.4 g/dL (ref 6.0–8.5)

## 2019-06-15 LAB — CBC WITH DIFFERENTIAL/PLATELET
Basophils Absolute: 0.1 10*3/uL (ref 0.0–0.2)
Basos: 1 %
EOS (ABSOLUTE): 0.5 10*3/uL — ABNORMAL HIGH (ref 0.0–0.4)
Eos: 5 %
Hematocrit: 40.1 % (ref 34.0–46.6)
Hemoglobin: 13.1 g/dL (ref 11.1–15.9)
Immature Grans (Abs): 0 10*3/uL (ref 0.0–0.1)
Immature Granulocytes: 0 %
Lymphocytes Absolute: 2.8 10*3/uL (ref 0.7–3.1)
Lymphs: 31 %
MCH: 28.5 pg (ref 26.6–33.0)
MCHC: 32.7 g/dL (ref 31.5–35.7)
MCV: 87 fL (ref 79–97)
Monocytes Absolute: 0.6 10*3/uL (ref 0.1–0.9)
Monocytes: 6 %
Neutrophils Absolute: 5.1 10*3/uL (ref 1.4–7.0)
Neutrophils: 57 %
Platelets: 252 10*3/uL (ref 150–450)
RBC: 4.6 x10E6/uL (ref 3.77–5.28)
RDW: 13 % (ref 11.7–15.4)
WBC: 9 10*3/uL (ref 3.4–10.8)

## 2019-06-15 LAB — LIPID PANEL
Chol/HDL Ratio: 3 ratio (ref 0.0–4.4)
Cholesterol, Total: 120 mg/dL (ref 100–199)
HDL: 40 mg/dL (ref >39)
LDL Calculated: 35 mg/dL (ref 0–99)
Triglycerides: 225 mg/dL — ABNORMAL HIGH (ref 0–149)
VLDL Cholesterol Cal: 45 mg/dL — ABNORMAL HIGH (ref 5–40)

## 2019-06-16 ENCOUNTER — Telehealth: Payer: Self-pay | Admitting: Family Medicine

## 2019-06-16 NOTE — Telephone Encounter (Signed)
Attempted to reach patient give her lab results.  Got voicemail.  Due to the nature of the results, will prefer to talk with patient as opposed to a long voicemail.  Will try again later.  Guadalupe Dawn MD PGY-3 Family Medicine Resident

## 2019-06-17 ENCOUNTER — Encounter: Payer: Self-pay | Admitting: Family Medicine

## 2019-06-17 DIAGNOSIS — L02419 Cutaneous abscess of limb, unspecified: Secondary | ICD-10-CM | POA: Insufficient documentation

## 2019-06-17 NOTE — Telephone Encounter (Addendum)
Telemedicine telephone note  Call patient to get lab results.  Informed her that her A1c increased from 5.9-10.5.  Explained this is likely the cause of her multiple skin abscesses recently.  Patient states that now that she knows about her A1c she has had increased urinary frequency, vision changes, and increased postprandial lethargy.  Explained to patient that I recommend schedule appointment soon as possible with Dr. Garlan Fillers for diabetes management.  Discussed other lab results with patient.  Informed her that her CBC and CMP came back normal.  Her lipid panel has markedly improved from 1 year ago on atorvastatin.  Her triglycerides remain increased.  Patient states that she was fasting for 8 hours prior to this.  Also defer management of this to Dr. Garlan Fillers.  Patient states that her I&D site is still a little sore, that she has very mild clear blood-tinged discharge.  Seems to be healing up well.  Patient appreciative of call, states that she will call clinic and schedule appointment after she gets off a phone with me.  Guadalupe Dawn MD PGY-3 Family Medicine Resident

## 2019-06-17 NOTE — Assessment & Plan Note (Signed)
Patient with multiple recent abscesses.  Drew A1c which came back at 10.5.  Last A1c from 04/2018 was 5.9.  Unclear what is causes dramatic increase.  Given the possibility of patient needing insulin, will defer management to PCP.  Instructed patient to follow-up with her PCP.

## 2019-06-17 NOTE — Assessment & Plan Note (Signed)
Patient with clear axillary abscess.  Likely secondary to ingrown hair.  I&D performed, please see attached procedure note.  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

## 2019-06-17 NOTE — Progress Notes (Signed)
   HPI 43 year old female who presents as walk-in for left axillary abscess.  Patient states that started a few days ago but she has not had time to get treated until now.  States that it is bothering her as it is rubbing up against her clothes and does not seem to have gotten any better.  Patient states that she has had 2 other abscesses, both on her abdomen, which spontaneously drained on their own.  She is curious as to why she is getting these abscesses.  CC: Left axillary abscess   ROS:   Review of Systems See HPI for ROS.   CC, SH/smoking status, and VS noted  Objective: BP 101/60   Pulse 61   SpO2 12%  Gen: 43 year old Caucasian female, no acute distress, very pleasant CV: RRR, no murmur Resp: CTAB, no wheezes, non-labored Neuro: Alert and oriented, Speech clear, No gross deficits Left axilla: Raised erythematous patch, with central purulence.      Assessment and plan:  Axillary abscess Patient with clear axillary abscess.  Likely secondary to ingrown hair.  I&D performed, please see attached procedure note.  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  Uncontrolled diabetes mellitus ([place]) Patient with multiple recent abscesses.  Drew A1c which came back at 10.5.  Last A1c from 04/2018 was 5.9.  Unclear what is causes dramatic increase.  Given the possibility of patient needing insulin, will defer management to PCP.  Instructed patient to follow-up with her PCP.   Orders Placed This Encounter  Procedures  . Comprehensive metabolic panel    Order Specific Question:   Has the patient fasted?    Answer:   No  . CBC with Differential  . Lipid panel    Order Specific Question:   Has the patient fasted?    Answer:   No  . POCT glycosylated hemoglobin (Hb A1C)    Associate with Z13.1    Meds ordered this encounter  Medications  . doxycycline (VIBRA-TABS) 100 MG  tablet    Sig: Take 1 tablet (100 mg total) by mouth 2 (two) times daily.    Dispense:  14 tablet    Refill:  0     Guadalupe Dawn MD PGY-3 Family Medicine Resident  06/17/2019 11:49 AM

## 2019-06-17 NOTE — Progress Notes (Signed)
Family medicine procedure note Procedure: Left axillary abscess incision and drainage Performing physician: Dr. Guadalupe Dawn Supervising physician: Dr. Sherren Mocha McDiarmid Preprocedure diagnosis: Left axillary abscess Postprocedure diagnosis: Left axillary abscess Anesthetic used: Lidocaine with epinephrine  Procedure After informed consent was obtained patient was placed on her right side with left arm overhead.  ID area was sterilized with iodine swab x1, alcohol swab x3.  5 mL of lidocaine without epinephrine injected in this area. On injection a large amount of pus, blood, lidocaine shot out through the central purulent area.  Cruciform incision was placed in the central area of the abscess.  More pus and blood was expressed.  Once the area had been thoroughly drained to this provider satisfaction, pressure was held only for 10 minutes.  Once hemostasis had been achieved a Kerlix 4 x 4 was placed over the area, silk tape placed to hold in place.  Guadalupe Dawn MD PGY-3 Family Medicine Resident

## 2019-06-18 ENCOUNTER — Encounter: Payer: Self-pay | Admitting: Family Medicine

## 2019-06-21 ENCOUNTER — Ambulatory Visit (INDEPENDENT_AMBULATORY_CARE_PROVIDER_SITE_OTHER): Payer: 59 | Admitting: Family Medicine

## 2019-06-21 ENCOUNTER — Encounter: Payer: Self-pay | Admitting: Family Medicine

## 2019-06-21 ENCOUNTER — Other Ambulatory Visit: Payer: Self-pay

## 2019-06-21 VITALS — BP 132/88 | HR 87 | Wt 301.0 lb

## 2019-06-21 DIAGNOSIS — L02419 Cutaneous abscess of limb, unspecified: Secondary | ICD-10-CM | POA: Diagnosis not present

## 2019-06-21 DIAGNOSIS — E1165 Type 2 diabetes mellitus with hyperglycemia: Secondary | ICD-10-CM | POA: Diagnosis not present

## 2019-06-21 DIAGNOSIS — Z6841 Body Mass Index (BMI) 40.0 and over, adult: Secondary | ICD-10-CM

## 2019-06-21 LAB — POCT UA - MICROALBUMIN
Albumin/Creatinine Ratio, Urine, POC: <30
Creatinine, POC: 50 mg/dL
Microalbumin Ur, POC: 10 mg/L

## 2019-06-21 MED ORDER — INSULIN GLARGINE 100 UNIT/ML ~~LOC~~ SOLN: 10.0000 [IU] | SUBCUTANEOUS | 11 refills | Status: DC

## 2019-06-21 MED ORDER — FREESTYLE LITE DEVI: 1.0000 [IU] | Freq: Once | 0 refills | Status: AC

## 2019-06-21 MED ORDER — FREESTYLE LANCETS MISC: 12 refills | Status: AC

## 2019-06-21 MED ORDER — FREESTYLE LITE TEST VI STRP: ORAL_STRIP | 12 refills | Status: AC

## 2019-06-21 MED ORDER — METFORMIN HCL ER 500 MG PO TB24: 1000.0000 mg | ORAL_TABLET | Freq: Every day | ORAL | 3 refills | Status: DC

## 2019-06-21 MED FILL — FREESTYLE LITE TEST STRIP: 90 days supply | Qty: 100 | Fill #0

## 2019-06-21 MED FILL — FREESTYLE LANCETS: 90 days supply | Qty: 100 | Fill #0

## 2019-06-21 MED FILL — metFORMIN HCL ER 500 MG TB2: 500 | 30 days supply | Qty: 60 | Fill #0

## 2019-06-21 MED FILL — UNIFINE PENTIPS 31GX3/16": 31G X 5 MM | 30 days supply | Qty: 100 | Fill #0

## 2019-06-21 MED FILL — FREESTYLE LITE METER: 30 days supply | Qty: 1 | Fill #0

## 2019-06-21 MED FILL — LANTUS SOLOSTAR 100 UNITS/M: 100 | 30 days supply | Qty: 3 | Fill #0

## 2019-06-21 MED FILL — UNIFINE PENTIPS 31GX3/16: 31G X 5 MM | 30 days supply | Qty: 100 | Fill #0

## 2019-06-21 NOTE — Patient Instructions (Signed)
It was a pleasure to see you today! Thank you for choosing [facility] for your primary care. Amy Osborne was seen for new diabetes. Come back to the clinic for your appointment on August 3, and go to the emergency room if you start having symptoms of DKA that we talked about..   1.  Today we started you on Lantus.  He does not necessarily our goal will be on Lantus forever but this can help bring some immediate control to your blood sugars.  We are going to start you on 10 units.  Would like you to take your blood sugars twice a day, once in the morning and once at night.  If you go 2 days where all of your blood sugars are over 200 you can add 2 units to your daily dose (for example go from 10 to 12 if all of your blood sugars for 2 days are over 200). 2.  We have also increased your metformin from 500 mg daily to 1000 mg daily.  If this causes an upset your stomach then you can go ahead and return to your prior dose of 500 mg. 3.  Dr. Garlan Osborne may start adding some other medications to reduce your need for Lantus and to control your blood sugar when he sees you on 3 August.  Please go to that appointment. 4.  We are going to give you a business card for Dr. Jenne Osborne who is our nutritionist.  They can help you with your diabetic diet. 5.  Were also going to refer you at your request to bariatric surgery, we know that this is been something you been pursuing in the past.  We want to be clear that this is not necessarily required to manage her diabetes but that if you feel this is the most helpful way to work on your weight we will offer you this referral to start that discussion with them again. 6.  We did not notice any problems with your diabetic foot exam today.  It looks like you are not due yet for your annual diabetic eye exam but you seem to understand that very well.  Have no visual problems. 7.  Please increase your fluid intake to at least 6 bottles of water per day.  Your thirst should start  coming down as we get your blood sugars under control.  If you start getting symptoms of DKA please go immediately to the hospital.   Please bring all your medications to every doctors visit   Sign up for My Chart to have easy access to your labs results, and communication with your Primary care physician.     Please check-out at the front desk before leaving the clinic.     Best,  Dr. Sherene Osborne FAMILY MEDICINE RESIDENT - PGY3 06/21/2019 3:03 PM

## 2019-06-28 ENCOUNTER — Other Ambulatory Visit: Payer: Self-pay

## 2019-06-28 ENCOUNTER — Ambulatory Visit (INDEPENDENT_AMBULATORY_CARE_PROVIDER_SITE_OTHER): Payer: 59 | Admitting: Family Medicine

## 2019-06-28 VITALS — BP 127/65 | HR 60 | Wt 302.2 lb

## 2019-06-28 DIAGNOSIS — E669 Obesity, unspecified: Secondary | ICD-10-CM

## 2019-06-28 DIAGNOSIS — E1165 Type 2 diabetes mellitus with hyperglycemia: Secondary | ICD-10-CM | POA: Diagnosis not present

## 2019-06-28 LAB — GLUCOSE, POCT (MANUAL RESULT ENTRY): POC Glucose: 172 mg/dl — AB (ref 70–99)

## 2019-06-28 NOTE — Progress Notes (Signed)
     Subjective: Chief Complaint  Patient presents with  . Diabetes    HPI: Amy Osborne is a 43 y.o. presenting to clinic today to discuss the following:  DMT2 Follow Up CBG today is 172 at office. Patient has been taking 12U of Lantus and 1000mg  of Metformin extended release daily. She keeps a record of her fasting morning glucose reading on her smart phone and the trend is coming down. Remains somewhat high with last few readings all above 150.   ROS noted in HPI.   Past Medical, Surgical, Social, and Family History Reviewed & Updated per EMR.   Pertinent Historical Findings include:   Social History   Tobacco Use  Smoking Status Former Smoker  . Types: Cigarettes  . Quit date: 11/26/2003  . Years since quitting: 15.5  Smokeless Tobacco Never Used    Objective: BP 127/65   Pulse 60   Wt (!) 302 lb 3.2 oz (137.1 kg)   SpO2 97%   BMI 50.29 kg/m  Vitals and nursing notes reviewed  Physical Exam Gen: Alert and Oriented x 3, NAD HEENT: Normocephalic, atraumatic Neck: trachea midline, no LAD CV: RRR, no murmurs, normal S1, S2 split Resp: CTAB, no wheezing, rales, or rhonchi, comfortable work of breathing Ext: no clubbing, cyanosis, or edema Skin: warm, dry, intact, no rashes  Results for orders placed or performed in visit on 06/28/19 (from the past 72 hour(s))  Glucose (CBG)     Status: Abnormal   Collection Time: 06/28/19  3:44 PM  Result Value Ref Range   POC Glucose 172 (A) 70 - 99 mg/dl    Assessment/Plan:  Uncontrolled diabetes mellitus ([place]) Patient has long term goal of controlling her DM with diet and weight loss and oral medications. For now, we will continue titration of Lantus to get her to goal A1c faster. - Titrate up by 1U for everyday her morning fasting glucose is above 170 - Continue Metformin 1000mg  for now, increase to 2000mg  next visit - F/u in one month - Cont lifestyle modifications   PATIENT EDUCATION PROVIDED: See AVS    Diagnosis  and plan along with any newly prescribed medication(s) were discussed in detail with this patient today. The patient verbalized understanding and agreed with the plan. Patient advised if symptoms worsen return to clinic or ER.    Orders Placed This Encounter  Procedures  . Glucose (CBG)    No orders of the defined types were placed in this encounter.  Amy Rutherford, DO 06/28/2019, 3:30 PM PGY-3 [place]

## 2019-06-28 NOTE — Progress Notes (Signed)
Subjective:  Amy Osborne is a 43 y.o. female who presents to the [facility] today with a chief complaint of new diabetes diagnosis.   HPI: Obesity Patient tends to significantly cut down sugar intake given new diagnosis of diabetes.  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  She accepts a referral to diabetes nutrition which should help her knowledge level of this area.  She also says that she has had a history of trying to work with bariatric surgery, she had stopped that work-up in the past over financial reasons but now says she has better insurance and would like to restart that referral process with bariatric.  Husband is obese but had great effects with bariatric surgery.  Uncontrolled diabetes mellitus ([place]) New diagnosis of diabetes.  Patient with recent home blood sugars approximately 300.  She is not showing any clinical signs of DKA.  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  She has been generally fatigued on the last few weeks but largely identifies that as her working schedule as a Marine scientist although she admits that diabetes likely is contributing.  Axillary abscess Healing appropriately, no change in plan from prior.   Objective:  Physical Exam: BP 132/88   Pulse 87   Wt (!) 301 lb (136.5 kg)   SpO2 97%   BMI 50.09 kg/m   Gen: NAD, conversing comfortably CV: RRR with no murmurs appreciated Pulm: NWOB, CTAB with no crackles, wheezes, or rhonchi GI: Normal bowel sounds present. Soft, Nontender, Nondistended. MSK: no edema, cyanosis, or clubbing noted Skin: warm, dry *Diabetic foot exam, no noted decrease in sensation, no wounding or ulcers. Neuro: grossly normal, moves all extremities Psych: Normal affect and thought content  No results found for this or any previous visit (from the past 72 hour(s)).   Assessment/Plan:   Axillary abscess Healing appropriately, no change in plan from prior.  Obesity Patient tends to significantly cut down sugar intake given new diagnosis of diabetes.  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  She accepts a referral to diabetes nutrition which should help her knowledge level of this area.  She also says that she has had a history of trying to work with bariatric surgery, she had stopped that work-up in the past over financial reasons but now says she has better insurance and would like to restart that referral process with bariatric.  Patient instructed to lower sugar intake, referral to diabetic education, referral to bariatric surgery.  Patient will also work on doing some level of physical activity at least 30 minutes/day.  Uncontrolled diabetes mellitus ([place]) New diagnosis of diabetes.  Patient with recent home blood sugars approximately 300.  She is not showing any clinical signs of DKA.  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  -Double metformin to 1000 from 500. -Glucometer and supplies sent to pharmacy, will check blood sugars 2 times per day. We will start on Lantus 10 units.  She is instructed that it for 2 days straight all of her blood sugars are over 200, that she should increase her Lantus by 2 units/day.  She would likely take her Lantus approximately 4-5p.m. in the evening because she constantly switches shifts  and this is a time that she tends to be consistently awake.  Of note we specifically discussed that she has a goal of not being on Lantus long-term.  We think with significant diet and weight control, exercise, optimization of non-insulin medications that she can drastically decrease and hopefully eliminate her need for Lantus.  We are starting this is an immediate medicine to avoid DKA why she gets her  other regimen ramped up. -Urine microalbumin was drawn, was normal -Diabetic foot exam was normal -Patient's last diabetic eye exam was less than 1 year ago and was normal. -Follow-up appointment with PCP on August 3.  He had contacted me personally prior to this appointment and given significant input so he is up-to-date with plan.    Marthenia RollingScott Camelia Stelzner, DO FAMILY MEDICINE RESIDENT - PGY2 06/28/2019 7:32 AM

## 2019-06-28 NOTE — Assessment & Plan Note (Signed)
New diagnosis of diabetes.  Patient with recent home blood sugars approximately 300.  She is not showing any clinical signs of DKA.  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  -Double metformin to 1000 from 500. -Glucometer and supplies sent to pharmacy, will check blood sugars 2 times per day. We will start on Lantus 10 units.  She is instructed that it for 2 days straight all of her blood sugars are over 200, that she should increase her Lantus by 2 units/day.  She would likely take her Lantus approximately 4-5p.m. in the evening because she constantly switches shifts and this is a time that she tends to be consistently awake.  Of note we specifically discussed that she has a goal of not being on Lantus long-term.  We think with significant diet and weight control, exercise, optimization of non-insulin medications that she can drastically decrease and hopefully eliminate her need for Lantus.  We are starting this is an immediate medicine to avoid DKA why she gets her other regimen ramped up. -Urine microalbumin was drawn, was normal -Diabetic foot exam was normal -Patient's last diabetic eye exam was less than 1 year ago and was normal. -Follow-up appointment with PCP on August 3.  He had contacted me personally prior to this appointment and given significant input so he is up-to-date with plan.

## 2019-06-28 NOTE — Patient Instructions (Signed)
It was great to meet you today! Thank you for letting me participate in your care!  Today, we discussed your diabetes and I am glad you have made so many positive lifestyle changes! Keep up the good work!!  We agreed today to start increasing Lantus 1U per day each time your morning fasting glucose is above 170. We did not changes your Metformin dose today. Eventually, when you are at goal we will discuss how to transition off of Lantus and keep your blood sugar controlled with diet, exercise, and oral medications.  I will see you back in one month to see how you are doing!  Be well, [name], DO PGY-3, [facility]

## 2019-06-28 NOTE — Assessment & Plan Note (Signed)
Healing appropriately, no change in plan from prior.

## 2019-06-28 NOTE — Assessment & Plan Note (Signed)
Patient tends to significantly cut down sugar intake given new diagnosis of diabetes.  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  She accepts a referral to diabetes nutrition which should help her knowledge level of this area.  She also says that she has had a history of trying to work with bariatric surgery, she had stopped that work-up in the past over financial reasons but now says she has better insurance and would like to restart that referral process with bariatric.  Patient instructed to lower sugar intake, referral to diabetic education, referral to bariatric surgery.  Patient will also work on doing some level of physical activity at least 30 minutes/day.

## 2019-06-30 NOTE — Assessment & Plan Note (Signed)
Patient has long term goal of controlling her DM with diet and weight loss and oral medications. For now, we will continue titration of Lantus to get her to goal A1c faster. - Titrate up by 1U for everyday her morning fasting glucose is above 170 - Continue Metformin 1000mg  for now, increase to 2000mg  next visit - F/u in one month - Cont lifestyle modifications

## 2019-07-20 ENCOUNTER — Other Ambulatory Visit: Payer: Self-pay | Admitting: Family Medicine

## 2019-07-20 DIAGNOSIS — F411 Generalized anxiety disorder: Secondary | ICD-10-CM

## 2019-07-20 MED FILL — ESCITALOPRAM 20 MG TABLET: 20 | 30 days supply | Qty: 30 | Fill #0

## 2019-07-20 MED FILL — LANTUS SOLOSTAR 100 UNITS/M: 100 | 30 days supply | Qty: 3 | Fill #1

## 2019-07-20 MED FILL — metFORMIN HCL ER 500 MG TB2: 500 | 30 days supply | Qty: 60 | Fill #1

## 2019-07-20 MED FILL — OMEPRAZOLE 20 MG CAPSULE DR: 20 | 30 days supply | Qty: 30 | Fill #1

## 2019-07-30 ENCOUNTER — Ambulatory Visit (INDEPENDENT_AMBULATORY_CARE_PROVIDER_SITE_OTHER): Payer: 59 | Admitting: Family Medicine

## 2019-07-30 ENCOUNTER — Other Ambulatory Visit: Payer: Self-pay

## 2019-07-30 ENCOUNTER — Encounter: Payer: Self-pay | Admitting: Family Medicine

## 2019-07-30 VITALS — BP 120/72 | HR 59

## 2019-07-30 DIAGNOSIS — M2550 Pain in unspecified joint: Secondary | ICD-10-CM | POA: Diagnosis not present

## 2019-07-30 DIAGNOSIS — E1165 Type 2 diabetes mellitus with hyperglycemia: Secondary | ICD-10-CM | POA: Diagnosis not present

## 2019-07-30 MED ORDER — DICLOFENAC SODIUM 1 % TD GEL: TRANSDERMAL | 3 refills | Status: AC

## 2019-07-30 MED ORDER — METFORMIN HCL ER (MOD) 1000 MG PO TB24: 2000.0000 mg | ORAL_TABLET | Freq: Every day | ORAL | 3 refills | Status: DC

## 2019-07-30 MED ORDER — MELOXICAM 15 MG PO TABS: 15.0000 mg | ORAL_TABLET | Freq: Every day | ORAL | 0 refills | Status: DC

## 2019-07-30 MED FILL — MELOXICAM 15 MG TABLET: 15 | 14 days supply | Qty: 14 | Fill #0

## 2019-07-30 MED FILL — DICLOFENAC SODIUM 1 % GEL: 1 | 25 days supply | Qty: 400 | Fill #0

## 2019-07-30 NOTE — Patient Instructions (Addendum)
It was great to see you today! Thank you for letting me participate in your care!  Today, we discussed your diabetes and I am so pleased with all the changes you have made. Keep up the good work!  I have increased your Metformin to 2000mg  daily. Continue to titrate up on Lantus ONLY if your fasting morning blood sugar is greater than 170.   I have sent you a prescription for Meloxicam for your hand pain that may be due to arthritis. If it does not improve please let me know and return to the clinic for further work up.  Be well, [name], DO PGY-3, [facility]

## 2019-07-30 NOTE — Assessment & Plan Note (Signed)
Bilateral hand pain that has continued for some time. She did have lab work up for RA which was all negative in 2018. No active flare but pain is constant with history concerning for possible seronegative RA. However, her symptoms are consistent with OA. - Meloxicam 15mg  daily for 2 weeks; if no improvement patient will return to clinic for xrays and more workup

## 2019-07-30 NOTE — Assessment & Plan Note (Signed)
Patient has much better control on Lantus and Metformin over the past month with glucose with fasting ranges from 109-140s.  - Cont 13U of Lantus daily - Increasing Metformin to 2000mg  daily - F/u in two months - On return perform diabetic foot exam and A1c

## 2019-07-30 NOTE — Progress Notes (Signed)
     Subjective: Chief Complaint  Patient presents with  . Follow-up   HPI: Amy Osborne is a 43 y.o. presenting to clinic today to discuss the following:  T2DM Follow Up Patient has been checking fasting blood glucose every morning with ranges from 109 to 140s. She is currently taking 13U of Lantus at night and still tolerating Metformin 1000mg  daily. She has made many dietary lifestyle changes making better food choices, avoiding sugary foods and snacks, and sweet drinks.    She denies any hypoglycemic events of dizziness, weakness, confusion, no fevers, chills, abdominal pain, nausea, vomiting, polydypsia, polyuria, or diarrhea.  ROS noted in HPI.   Past Medical, Surgical, Social, and Family History Reviewed & Updated per EMR.   Pertinent Historical Findings include:   Social History   Tobacco Use  Smoking Status Former Smoker  . Types: Cigarettes  . Quit date: 11/26/2003  . Years since quitting: 15.6  Smokeless Tobacco Never Used    Objective: BP 120/72   Pulse (!) 59   SpO2 98%  Vitals and nursing notes reviewed  Physical Exam Gen: Alert and Oriented x 3, NAD HEENT: Normocephalic, atraumatic CV: RRR, no murmurs, normal S1, S2 split Resp: CTAB, no wheezing, rales, or rhonchi, comfortable work of breathing Ext: no clubbing, cyanosis, or edema Skin: warm, dry, intact, no rashes  Assessment/Plan:  Uncontrolled diabetes mellitus ([place]) Patient has much better control on Lantus and Metformin over the past month with glucose with fasting ranges from 109-140s.  - Cont 13U of Lantus daily - Increasing Metformin to 2000mg  daily - F/u in two months - On return perform diabetic foot exam and A1c  Arthralgia Bilateral hand pain that has continued for some time. She did have lab work up for RA which was all negative in 2018. No active flare but pain is constant with history concerning for possible seronegative RA. However, her symptoms are consistent with OA. - Meloxicam  15mg  daily for 2 weeks; if no improvement patient will return to clinic for xrays and more workup   PATIENT EDUCATION PROVIDED: See AVS    Diagnosis and plan along with any newly prescribed medication(s) were discussed in detail with this patient today. The patient verbalized understanding and agreed with the plan. Patient advised if symptoms worsen return to clinic or ER.    Meds ordered this encounter  Medications  . diclofenac sodium (VOLTAREN) 1 % GEL    Sig: APPLY 4 GM'S TOPICALLY 4 TIMES DAILY.    Dispense:  350 g    Refill:  3  . metFORMIN (GLUMETZA) 1000 MG (MOD) 24 hr tablet    Sig: Take 2 tablets (2,000 mg total) by mouth daily with breakfast.    Dispense:  60 tablet    Refill:  3  . meloxicam (MOBIC) 15 MG tablet    Sig: Take 1 tablet (15 mg total) by mouth daily for 14 days.    Dispense:  14 tablet    Refill:  0     [name], DO 07/30/2019, 8:29 AM PGY-3 [place]

## 2019-08-03 ENCOUNTER — Telehealth: Payer: Self-pay

## 2019-08-03 MED FILL — METFORMIN HCL ER 500 MG TAB: 500 | 30 days supply | Qty: 120 | Fill #0

## 2019-08-03 MED FILL — METOPROLOL SUCCINATE ER 200: 200 | 30 days supply | Qty: 30 | Fill #2

## 2019-08-03 NOTE — Telephone Encounter (Signed)
Pharmacy calls nurse line stating they have switched Metformin 1000mg  to Metformin 500mg  and patient will take 4. This is the only way patients insurance will cover.

## 2019-08-07 ENCOUNTER — Emergency Department (HOSPITAL_BASED_OUTPATIENT_CLINIC_OR_DEPARTMENT_OTHER)
Admission: EM | Admit: 2019-08-07 | Discharge: 2019-08-07 | Disposition: A | Discharge status: Home / Self Care | Payer: 59 | Attending: Emergency Medicine | Admitting: Emergency Medicine

## 2019-08-07 ENCOUNTER — Encounter (HOSPITAL_BASED_OUTPATIENT_CLINIC_OR_DEPARTMENT_OTHER): Payer: Self-pay | Admitting: Emergency Medicine

## 2019-08-07 ENCOUNTER — Other Ambulatory Visit: Payer: Self-pay

## 2019-08-07 DIAGNOSIS — I10 Essential (primary) hypertension: Secondary | ICD-10-CM | POA: Diagnosis not present

## 2019-08-07 DIAGNOSIS — Z794 Long term (current) use of insulin: Secondary | ICD-10-CM | POA: Diagnosis not present

## 2019-08-07 DIAGNOSIS — Z87891 Personal history of nicotine dependence: Secondary | ICD-10-CM | POA: Diagnosis not present

## 2019-08-07 DIAGNOSIS — E119 Type 2 diabetes mellitus without complications: Secondary | ICD-10-CM | POA: Insufficient documentation

## 2019-08-07 DIAGNOSIS — Z20828 Contact with and (suspected) exposure to other viral communicable diseases: Secondary | ICD-10-CM | POA: Insufficient documentation

## 2019-08-07 DIAGNOSIS — R509 Fever, unspecified: Secondary | ICD-10-CM | POA: Insufficient documentation

## 2019-08-07 DIAGNOSIS — Z79899 Other long term (current) drug therapy: Secondary | ICD-10-CM | POA: Diagnosis not present

## 2019-08-07 LAB — URINALYSIS, ROUTINE W REFLEX MICROSCOPIC
Bilirubin Urine: NEGATIVE
Glucose, UA: NEGATIVE mg/dL
Hgb urine dipstick: NEGATIVE
Ketones, ur: NEGATIVE mg/dL
Leukocytes,Ua: NEGATIVE
Nitrite: NEGATIVE
Protein, ur: NEGATIVE mg/dL
Specific Gravity, Urine: 1.02 (ref 1.005–1.030)
pH: 6 (ref 5.0–8.0)

## 2019-08-07 LAB — CBC WITH DIFFERENTIAL/PLATELET
Abs Immature Granulocytes: 0.05 10*3/uL (ref 0.00–0.07)
Basophils Absolute: 0.1 10*3/uL (ref 0.0–0.1)
Basophils Relative: 1 %
Eosinophils Absolute: 0.3 10*3/uL (ref 0.0–0.5)
Eosinophils Relative: 3 %
HCT: 40.3 % (ref 36.0–46.0)
Hemoglobin: 12.9 g/dL (ref 12.0–15.0)
Immature Granulocytes: 1 %
Lymphocytes Relative: 29 %
Lymphs Abs: 3 10*3/uL (ref 0.7–4.0)
MCH: 28.2 pg (ref 26.0–34.0)
MCHC: 32 g/dL (ref 30.0–36.0)
MCV: 88 fL (ref 80.0–100.0)
Monocytes Absolute: 0.6 10*3/uL (ref 0.1–1.0)
Monocytes Relative: 6 %
Neutro Abs: 6.3 10*3/uL (ref 1.7–7.7)
Neutrophils Relative %: 60 %
Platelets: 279 10*3/uL (ref 150–400)
RBC: 4.58 MIL/uL (ref 3.87–5.11)
RDW: 13.7 % (ref 11.5–15.5)
WBC: 10.3 10*3/uL (ref 4.0–10.5)
nRBC: 0 % (ref 0.0–0.2)

## 2019-08-07 LAB — BASIC METABOLIC PANEL
Anion gap: 9 (ref 5–15)
BUN: 10 mg/dL (ref 6–20)
CO2: 24 mmol/L (ref 22–32)
Calcium: 9.3 mg/dL (ref 8.9–10.3)
Chloride: 104 mmol/L (ref 98–111)
Creatinine, Ser: 0.64 mg/dL (ref 0.44–1.00)
GFR calc Af Amer: >60 mL/min (ref >60)
GFR calc non Af Amer: >60 mL/min (ref >60)
Glucose, Bld: 150 mg/dL — ABNORMAL HIGH (ref 70–99)
Potassium: 3.9 mmol/L (ref 3.5–5.1)
Sodium: 137 mmol/L (ref 135–145)

## 2019-08-07 LAB — SARS CORONAVIRUS 2 BY RT PCR (HOSPITAL ORDER, PERFORMED IN ~~LOC~~ HOSPITAL LAB): SARS Coronavirus 2: NEGATIVE

## 2019-08-07 LAB — PREGNANCY, URINE: Preg Test, Ur: NEGATIVE
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
## 2019-08-07 NOTE — Discharge Instructions (Signed)
You were seen in the emergency department for a fever and some generalized fatigue.  Your lab work and Covid testing were unremarkable.  Please continue Tylenol and ibuprofen and drink plenty of fluids and get rest.  Follow-up with your doctor and return to the emergency department if any worsening symptoms.

## 2019-08-07 NOTE — ED Notes (Signed)
Pt verbalized understanding of dc instructions.

## 2019-08-07 NOTE — ED Triage Notes (Signed)
Pt here with diarrhea, weakness and fever x 1 day.

## 2019-08-12 ENCOUNTER — Encounter: Payer: Self-pay | Admitting: Family Medicine

## 2019-08-13 ENCOUNTER — Other Ambulatory Visit: Payer: Self-pay | Admitting: Family Medicine

## 2019-08-13 MED ORDER — MELOXICAM 15 MG PO TABS: 15.0000 mg | ORAL_TABLET | Freq: Every day | ORAL | 0 refills | Status: AC

## 2019-08-13 MED FILL — MELOXICAM 15 MG TABLET: 15 | 14 days supply | Qty: 14 | Fill #0

## 2019-08-13 NOTE — Progress Notes (Signed)
Patient requested refill. Sending to pharmacy and communicated with patient via mychart if pain continues to schedule an appointment with me to take next steps as I do not want to continue Meloxicam long term

## 2019-08-24 MED FILL — LANTUS SOLOSTAR 100 UNITS/M: 100 | 30 days supply | Qty: 3 | Fill #2

## 2019-08-27 ENCOUNTER — Other Ambulatory Visit: Payer: Self-pay | Admitting: Family Medicine

## 2019-08-27 DIAGNOSIS — F411 Generalized anxiety disorder: Secondary | ICD-10-CM

## 2019-08-27 MED FILL — ESCITALOPRAM 20 MG TABLET: 20 | 30 days supply | Qty: 30 | Fill #0

## 2019-08-27 MED FILL — OMEPRAZOLE 20 MG CAPSULE DR: 20 | 30 days supply | Qty: 30 | Fill #0

## 2019-08-27 MED FILL — METOPROLOL SUCCINATE ER 200: 200 | 30 days supply | Qty: 30 | Fill #3

## 2019-09-13 ENCOUNTER — Other Ambulatory Visit: Payer: Self-pay | Admitting: Family Medicine

## 2019-09-13 MED FILL — METFORMIN HCL ER 500 MG TB2: 500 | 30 days supply | Qty: 120 | Fill #1

## 2019-09-15 MED FILL — ATORVASTATIN 40 MG TABLET: 40 | 90 days supply | Qty: 90 | Fill #0

## 2019-09-21 MED FILL — LANTUS SOLOSTAR 100 UNITS/M: 100 | 30 days supply | Qty: 3 | Fill #3

## 2019-09-23 ENCOUNTER — Encounter: Payer: 59 | Attending: Family Medicine | Admitting: Skilled Nursing Facility1

## 2019-09-23 ENCOUNTER — Other Ambulatory Visit: Payer: Self-pay

## 2019-09-23 DIAGNOSIS — E119 Type 2 diabetes mellitus without complications: Secondary | ICD-10-CM | POA: Diagnosis not present

## 2019-09-23 NOTE — Progress Notes (Signed)
Diabetes Self-Management Education  Visit Type: First/Initial  09/23/2019  [name], identified by name and date of birth, is a 43 y.o. female with a diagnosis of Diabetes:  .   ASSESSMENT  Height 5\' 5"  (1.651 m), weight 298 lb 1.6 oz (135.2 kg). Body mass index is 49.61 kg/m.   Pt states she really likes her PCP and believes he has done a good job with managing her DM. Pt has been here for bariatric surgery assessment in the past and states she is getting back into the pathway for surgery.  Pt states she does have a daughter that has low functioning  autiism and is her care giver.  Medications: lantus, metformin  Pt states her husband has had the RYGB. Pt states she feels he and her can support one another in the lifestyle changes.  Pt states she checks her fasting glucose: 145 and then sometimes in the evening 160 Pt states she works third shift. Pt states she sleeps okay.  Pt states her daughter enjoys dancing so she likes to do that with her.  Pt states she really enjoys sweet foods and really struggles with that.  Goals: Journal when you are having cravings for sweets Put vegetables on your kids plate Continue to preplan your meals Use the meal ideas sheet to put together balanced meals Check your blood sugar 2 hours after you eat   Diabetes Self-Management Education - 09/23/19 0924      Visit Information   Visit Type  First/Initial      Initial Visit   Are you currently following a meal plan?  No    Are you taking your medications as prescribed?  Yes      Health Coping   How would you rate your overall health?  Good      Psychosocial Assessment   Patient Belief/Attitude about Diabetes  Motivated to manage diabetes    Self-management support  Family    Patient Concerns  Nutrition/Meal planning    Learning Readiness  Contemplating      Pre-Education Assessment   Patient understands the diabetes disease and treatment process.  Needs Instruction    Patient  understands incorporating nutritional management into lifestyle.  Needs Instruction    Patient undertands incorporating physical activity into lifestyle.  Needs Instruction    Patient understands using medications safely.  Needs Instruction    Patient understands monitoring blood glucose, interpreting and using results  Needs Instruction    Patient understands prevention, detection, and treatment of acute complications.  Needs Instruction    Patient understands prevention, detection, and treatment of chronic complications.  Needs Instruction    Patient understands how to develop strategies to address psychosocial issues.  Needs Instruction    Patient understands how to develop strategies to promote health/change behavior.  Needs Instruction      Complications   Last HgB A1C per patient/outside source  10.5 %    How often do you check your blood sugar?  1-2 times/day    Fasting Blood glucose range (mg/dL)  09/25/19    Postprandial Blood glucose range (mg/dL)  160-109    Number of hypoglycemic episodes per month  0    Number of hyperglycemic episodes per week  0    Have you had a dilated eye exam in the past 12 months?  Yes    Have you had a dental exam in the past 12 months?  Yes    Are you checking your feet?  Yes  How many days per week are you checking your feet?  7      Dietary Intake   Breakfast  instant oatmeal and protein drink    Snack (morning)  cucmber in ranch or vinegar    Lunch  high fiber tortilla with chicken with lettuce and cheese and mustard    Dinner  chicken and salad or chili    Beverage(s)  water with flavroing, protein shake, diet soda      Exercise   Exercise Type  ADL's    How many days per week to you exercise?  0    How many minutes per day do you exercise?  0    Total minutes per week of exercise  0      Patient Education   Previous Diabetes Education  No    Disease state   Factors that contribute to the development of diabetes    Acute complications   Discussed and identified patients' treatment of hyperglycemia.;Taught treatment of hypoglycemia - the 15 rule.      Individualized Goals (developed by patient)   Nutrition  General guidelines for healthy choices and portions discussed;Follow meal plan discussed    Physical Activity  Exercise 5-7 days per week;30 minutes per day    Monitoring   test my blood glucose as discussed;test blood glucose pre and post meals as discussed      Post-Education Assessment   Patient understands the diabetes disease and treatment process.  Demonstrates understanding / competency    Patient understands incorporating nutritional management into lifestyle.  Demonstrates understanding / competency    Patient undertands incorporating physical activity into lifestyle.  Demonstrates understanding / competency    Patient understands using medications safely.  Demonstrates understanding / competency    Patient understands monitoring blood glucose, interpreting and using results  Demonstrates understanding / competency    Patient understands prevention, detection, and treatment of acute complications.  Demonstrates understanding / competency    Patient understands prevention, detection, and treatment of chronic complications.  Demonstrates understanding / competency    Patient understands how to develop strategies to address psychosocial issues.  Demonstrates understanding / competency    Patient understands how to develop strategies to promote health/change behavior.  Demonstrates understanding / competency      Outcomes   Expected Outcomes  Demonstrated interest in learning. Expect positive outcomes    Future DMSE  4-6 wks    Program Status  Completed       Individualized Plan for Diabetes Self-Management Training:   Learning Objective:  Patient will have a greater understanding of diabetes self-management. Patient education plan is to attend individual and/or group sessions per assessed needs and concerns.    Plan:   There are no Patient Instructions on file for this visit.  Expected Outcomes:  Demonstrated interest in learning. Expect positive outcomes  Education material provided: ADA - How to Thrive: A Guide for Your Journey with Diabetes and Meal plan card  If problems or questions, patient to contact team via:  Phone  Future DSME appointment: 4-6 wks

## 2019-10-01 ENCOUNTER — Telehealth: Payer: 59 | Admitting: Nurse Practitioner

## 2019-10-01 DIAGNOSIS — B9689 Other specified bacterial agents as the cause of diseases classified elsewhere: Secondary | ICD-10-CM

## 2019-10-01 DIAGNOSIS — J329 Chronic sinusitis, unspecified: Secondary | ICD-10-CM

## 2019-10-01 MED ORDER — AMOXICILLIN-POT CLAVULANATE 875-125 MG PO TABS: 1.0000 | ORAL_TABLET | Freq: Two times a day (BID) | ORAL | 0 refills | Status: AC

## 2019-10-01 MED ORDER — FLUTICASONE PROPIONATE 50 MCG/ACT NA SUSP: 2.0000 | Freq: Every day | NASAL | 0 refills | Status: DC

## 2019-10-01 MED FILL — AMOX-CLAV 875-125 MG TABLET: 875-125 | 7 days supply | Qty: 14 | Fill #0

## 2019-10-01 MED FILL — FLUTICASONE PROP 50 MCG SPR: 50 | 30 days supply | Qty: 16 | Fill #0

## 2019-10-01 NOTE — Progress Notes (Signed)
We are sorry that you are not feeling well.  Here is how we plan to help!  Based on what you have shared with me it looks like you have sinusitis.  Sinusitis is inflammation and infection in the sinus cavities of the head.  Based on your presentation I believe you most likely have Acute Bacterial Sinusitis.  This is an infection caused by bacteria and is treated with antibiotics. I have prescribed Augmentin 875mg /125mg  one tablet twice daily with food, for 7 days. I am also prescribing Fluticasone (Flonase) nasal sprays, use 2 sprays daily in each nostril until symptoms improve.  You may use an oral decongestant such as Mucinex D or if you have glaucoma or high blood pressure use plain Mucinex. Saline nasal spray help and can safely be used as often as needed for congestion.  If you develop worsening sinus pain, fever or notice severe headache and vision changes, or if symptoms are not better after completion of antibiotic, please schedule an appointment with a health care provider.    Sinus infections are not as easily transmitted as other respiratory infection, however we still recommend that you avoid close contact with loved ones, especially the very young and elderly.  Remember to wash your hands thoroughly throughout the day as this is the number one way to prevent the spread of infection!  Home Care:  Only take medications as instructed by your medical team.  Complete the entire course of an antibiotic.  Do not take these medications with alcohol.  A steam or ultrasonic humidifier can help congestion.  You can place a towel over your head and breathe in the steam from hot water coming from a faucet.  Avoid close contacts especially the very young and the elderly.  Cover your mouth when you cough or sneeze.  Always remember to wash your hands.  Get Help Right Away If:  You develop worsening fever or sinus pain.  You develop a severe head ache or visual changes.  Your symptoms  persist after you have completed your treatment plan.  Make sure you  Understand these instructions.  Will watch your condition.  Will get help right away if you are not doing well or get worse.  Your e-visit answers were reviewed by a board certified advanced clinical practitioner to complete your personal care plan.  Depending on the condition, your plan could have included both over the counter or prescription medications.  If there is a problem please reply  once you have received a response from your provider.  Your safety is important to [place].  If you have drug allergies check your prescription carefully.    You can use MyChart to ask questions about today's visit, request a non-urgent call back, or ask for a work or school excuse for 24 hours related to this e-Visit. If it has been greater than 24 hours you will need to follow up with your provider, or enter a new e-Visit to address those concerns.  You will get an e-mail in the next two days asking about your experience.  I hope that your e-visit has been valuable and will speed your recovery. Thank you for using e-visits.  I have spent at least 5 minutes reviewing and documenting in the patient's chart.

## 2019-10-12 ENCOUNTER — Other Ambulatory Visit: Payer: Self-pay | Admitting: Family Medicine

## 2019-10-12 DIAGNOSIS — I10 Essential (primary) hypertension: Secondary | ICD-10-CM

## 2019-10-12 DIAGNOSIS — F411 Generalized anxiety disorder: Secondary | ICD-10-CM

## 2019-10-12 MED FILL — OMEPRAZOLE 20 MG CAPSULE DR: 20 | 30 days supply | Qty: 30 | Fill #1

## 2019-10-12 MED FILL — METFORMIN HCL ER 500 MG TB2: 500 | 30 days supply | Qty: 120 | Fill #2

## 2019-10-13 MED FILL — ESCITALOPRAM 20 MG TABLET: 20 | 30 days supply | Qty: 30 | Fill #0

## 2019-10-13 MED FILL — METOPROLOL SUCCINATE ER 200: 200 | 30 days supply | Qty: 30 | Fill #0

## 2019-10-15 MED FILL — LANTUS SOLOSTAR 100 UNITS/M: 100 | 30 days supply | Qty: 3 | Fill #4

## 2019-10-26 ENCOUNTER — Ambulatory Visit: Payer: 59 | Admitting: Skilled Nursing Facility1

## 2019-10-28 ENCOUNTER — Encounter: Payer: Self-pay | Admitting: Family Medicine

## 2019-11-02 ENCOUNTER — Ambulatory Visit: Payer: 59 | Admitting: Skilled Nursing Facility1

## 2019-11-05 ENCOUNTER — Other Ambulatory Visit: Payer: Self-pay

## 2019-11-05 ENCOUNTER — Telehealth (INDEPENDENT_AMBULATORY_CARE_PROVIDER_SITE_OTHER): Payer: 59 | Admitting: Family Medicine

## 2019-11-05 DIAGNOSIS — F411 Generalized anxiety disorder: Secondary | ICD-10-CM

## 2019-11-05 MED ORDER — HYDROXYZINE HCL 10 MG PO TABS: 10.0000 mg | ORAL_TABLET | Freq: Three times a day (TID) | ORAL | 0 refills | Status: AC | PRN

## 2019-11-05 MED ORDER — BUSPIRONE HCL 5 MG PO TABS: 5.0000 mg | ORAL_TABLET | Freq: Two times a day (BID) | ORAL | 0 refills | Status: DC

## 2019-11-05 MED FILL — busPIRone HCL 5 MG TABS: 5 | 30 days supply | Qty: 60 | Fill #0

## 2019-11-05 MED FILL — hydrOXYzine HCL 10 MG TABS: 10 | 5 days supply | Qty: 15 | Fill #0

## 2019-11-05 NOTE — Assessment & Plan Note (Signed)
Poorly controlled due to changes in current situation with going through a divorce from her husband. Discussed several medication options with her about how to control her anxiety. She opted to add a second agent and use Atarax as a rescue. Discussed after taking atarax not to drive or operate heavy machinery - Cont Lexapro 20mg  daily - Start Buspar 5mg  BID daily  - Atartax 10mg  prn - F/u in two weeks for virtual visit

## 2019-11-05 NOTE — Progress Notes (Signed)
[place] Telemedicine Visit  Patient consented to have virtual visit. Method of visit: Video was attempted, but technology challenges prevented patient from using video, so visit was conducted via telephone.  Encounter participants: Patient: Amy Osborne - located at home in [facility] Provider: Nuala Alpha - located at [facility] Others (if applicable): none  Chief Complaint: Anxiety  HPI:  Patient is a 43y/o female with PMH of HTN, T2DM, and GAD presenting today for uncontrolled anxiety. She recently let me know she is in the process of getting a divorce from her husband and this no top of her job duties and caring for her autistic daughter has understandably caused a great deal of stress and anxiety. She has had several episodes of feeling like she is experiencing "impending doom", increased heart rate, sweating, etc. She does endorse having a good support system and does not feel depressed hopeless, worthless or is missing work. NO SI or HI. She does endorse lack of sleep and changes in appetite.  ROS: per HPI  Pertinent PMHx: GAD  Exam:  Gen: NAD, tearful  Respiratory: NWOB, speaking easily in full sentences Skin: no rashes  Assessment/Plan:  Generalized anxiety disorder Poorly controlled due to changes in current situation with going through a divorce from her husband. Discussed several medication options with her about how to control her anxiety. She opted to add a second agent and use Atarax as a rescue. Discussed after taking atarax not to drive or operate heavy machinery - Cont Lexapro 20mg  daily - Start Buspar 5mg  BID daily  - Atartax 10mg  prn - F/u in two weeks for virtual visit    Time spent during visit with patient: >20 minutes  [name], DO [place], PGY-3

## 2019-11-10 ENCOUNTER — Telehealth: Payer: Self-pay | Admitting: *Deleted

## 2019-11-10 NOTE — Telephone Encounter (Signed)
-----  Message from Nuala Alpha, DO sent at 11/05/2019  9:35 AM EST ----- Regarding: Appointment for patient Hey, I met with this patient today and she needs a 2 week follow up but I'm on nights. I was trying to get her seen for a virtual visit before Christmas. Can yall call her and let her know I unfortunately won't be available but have scheduled her with Dr. Tammi Klippel on 12/28 @ 9:30am? If this is too long for her and she wants to be seen sooner can we schedule her something? Thanks!  Tim

## 2019-11-10 NOTE — Telephone Encounter (Signed)
LMOVM for pt to call us back and let us know if that appt is ok. Areanna Gengler Kennon Holter, CMA

## 2019-11-16 ENCOUNTER — Other Ambulatory Visit: Payer: Self-pay | Admitting: Family Medicine

## 2019-11-16 DIAGNOSIS — F411 Generalized anxiety disorder: Secondary | ICD-10-CM

## 2019-11-16 MED FILL — LANTUS SOLOSTAR 100 UNITS/M: 100 | 28 days supply | Qty: 3 | Fill #5

## 2019-11-16 MED FILL — DICLOFENAC SODIUM 1 % GEL: 1 | 25 days supply | Qty: 400 | Fill #1

## 2019-11-16 MED FILL — METOPROLOL SUCCINATE ER 200: 200 | 30 days supply | Qty: 30 | Fill #1

## 2019-11-16 MED FILL — METFORMIN HCL ER 500 MG TB2: 500 | 30 days supply | Qty: 120 | Fill #3

## 2019-11-17 MED FILL — OMEPRAZOLE 20 MG CAPSULE DR: 20 | 30 days supply | Qty: 30 | Fill #0

## 2019-11-17 MED FILL — ESCITALOPRAM 20 MG TABLET: 20 | 30 days supply | Qty: 30 | Fill #0

## 2019-11-23 ENCOUNTER — Ambulatory Visit: Payer: 59 | Admitting: Skilled Nursing Facility1

## 2019-11-23 NOTE — Progress Notes (Deleted)
[place] Telemedicine Visit I connected with  Amy Osborne on 11/23/19 by a video enabled telemedicine application and verified that I am speaking with the correct person using two identifiers.   I discussed the limitations of evaluation and management by telemedicine. The patient expressed understanding and agreed to proceed.   Patient consented to have virtual visit. Method of visit: {TELEPHONE VS TUUEK:80034}  Encounter participants: Patient: Amy Osborne - located at *** Provider: Caroline More - located at *** Others (if applicable): ***  Chief Complaint: ***  HPI: Anxiety Only seen by PCP on 12/11 for generalized anxiety disorder.  Patient at that time was on Lexapro 20 mg daily started BuSpar 5 mg twice daily, as well as Atarax 10 mg as needed.  Was advised to follow-up in 2 weeks with a virtual visit to assess anxiety. ***  ROS: per HPI  Pertinent PMHx: ***  Exam:  Respiratory: ***  Assessment/Plan:  No problem-specific Assessment & Plan notes found for this encounter.    Time spent during visit with patient: *** minutes

## 2019-11-24 ENCOUNTER — Other Ambulatory Visit: Payer: Self-pay

## 2019-11-24 ENCOUNTER — Telehealth (INDEPENDENT_AMBULATORY_CARE_PROVIDER_SITE_OTHER): Payer: 59 | Admitting: Family Medicine

## 2019-11-24 DIAGNOSIS — F411 Generalized anxiety disorder: Secondary | ICD-10-CM

## 2019-11-24 DIAGNOSIS — R509 Fever, unspecified: Secondary | ICD-10-CM

## 2019-11-24 NOTE — Progress Notes (Signed)
[place] Telemedicine Visit  Patient consented to have virtual visit. Method of visit: Telephone  Encounter participants: Patient: Amy Osborne - located at home Provider: Patriciaann Clan - located at home Others (if applicable): none   Chief Complaint: anxiety/sick  HPI: Amy Osborne is a 43 year old female with hypertension, type 2 diabetes, and generalized anxiety presenting discuss the following:  Anxiety: She was last seen by her PCP on 12/11 for anxiety.  At that time she was already on Lexapro 20 mg and then started on BuSpar 5 mg twice daily as well as Atarax 10 mg as needed.  She is following up today for her anxiety.  She is currently in the process of getting a divorce from her husband and cares for her autistic daughter. She feels she is doing much better. Only needed to use the Atarax twice. No SI or HI, no longer having severe sense of doom.  Able to do the things that she needs to do without her anxiety getting in the way.  Sick: Awaiting COVID results, went on Monday, started showing symptoms on Sunday. RN, works in the NICU. Daughter is positive for COVID, got results back yesterday. Started with runny nose, cough, now has blown into cough, congestion, HA, body aches, chills, fever. Denies any SOB, chest pain.    ROS: per HPI  Pertinent PMHx: DM, HTN   Exam:  Respiratory: Unlabored breathing, speaking in full sentences   Assessment/Plan:  Generalized anxiety disorder Improved following the addition of BuSpar.  Recommended continued stress relieving techniques and apps such as Calm, Breethe.  We will continue with the same regimen of Lexapro and BuSpar.  Febrile illness Suspected Covid, awaiting results from test on Monday.  Her daughter is already Covid positive.  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  Discussed ED precautions including but not limited to shortness of breath, chest pain,  confusion.     Follow-up if anxiety worsens or sooner if needed.  Recommended regular follow-up with PCP for uncontrolled diabetes and hypertension.   Time spent during visit with patient: 10 minutes  Patriciaann Clan, DO

## 2019-11-24 NOTE — Assessment & Plan Note (Signed)
Improved following the addition of BuSpar.  Recommended continued stress relieving techniques and apps such as Calm, Breethe.  We will continue with the same regimen of Lexapro and BuSpar.

## 2019-11-24 NOTE — Assessment & Plan Note (Signed)
Suspected Covid, awaiting results from test on Monday.  Her daughter is already Covid positive.  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  Discussed ED precautions including but not limited to shortness of breath, chest pain, confusion.

## 2019-11-29 ENCOUNTER — Encounter: Payer: Self-pay | Admitting: Family Medicine

## 2019-11-30 ENCOUNTER — Encounter: Payer: Self-pay | Admitting: Family Medicine

## 2019-11-30 ENCOUNTER — Other Ambulatory Visit: Payer: Self-pay | Admitting: Family Medicine

## 2019-11-30 MED ORDER — AMOXICILLIN 500 MG PO CAPS: 500.0000 mg | ORAL_CAPSULE | Freq: Two times a day (BID) | ORAL | 0 refills | Status: AC

## 2019-11-30 MED FILL — AMOXICILLIN 500 MG CAPSULE: 500 | 10 days supply | Qty: 20 | Fill #0

## 2019-11-30 NOTE — Progress Notes (Signed)
Patient called requesting antibiotic due to concern for strep throat as daughter was tested and was positive. She also has COVID. Considering we couldn't evaluate her in clinic with her symptoms I will send antibiotic.

## 2019-12-24 ENCOUNTER — Other Ambulatory Visit: Payer: Self-pay | Admitting: Family Medicine

## 2019-12-24 DIAGNOSIS — F411 Generalized anxiety disorder: Secondary | ICD-10-CM

## 2019-12-24 MED FILL — ESCITALOPRAM 20 MG TABLET: 20 | 30 days supply | Qty: 30 | Fill #0

## 2019-12-24 MED FILL — LANTUS SOLOSTAR 100 UNITS/M: 100 | 30 days supply | Qty: 3 | Fill #6

## 2019-12-24 MED FILL — OMEPRAZOLE DR 20 MG CAPSULE: 20 | 30 days supply | Qty: 30 | Fill #1

## 2019-12-24 MED FILL — ATORVASTATIN 40 MG TABLET: 40 | 90 days supply | Qty: 90 | Fill #1

## 2019-12-24 MED FILL — METOPROLOL SUCCINATE ER 200: 200 | 30 days supply | Qty: 30 | Fill #2

## 2020-02-11 ENCOUNTER — Other Ambulatory Visit: Payer: Self-pay | Admitting: Family Medicine

## 2020-02-11 DIAGNOSIS — F411 Generalized anxiety disorder: Secondary | ICD-10-CM

## 2020-02-11 MED FILL — OMEPRAZOLE DR 20 MG CAPSULE: 20 | 30 days supply | Qty: 30 | Fill #0

## 2020-02-11 MED FILL — busPIRone HCL 5 MG TABS: 5 | 30 days supply | Qty: 60 | Fill #0

## 2020-02-11 MED FILL — METOPROLOL SUCCINATE ER 200: 200 | 30 days supply | Qty: 30 | Fill #3

## 2020-02-11 MED FILL — ESCITALOPRAM 20 MG TABLET: 20 | 30 days supply | Qty: 30 | Fill #0

## 2020-03-12 ENCOUNTER — Emergency Department (HOSPITAL_COMMUNITY)
Admission: EM | Admit: 2020-03-12 | Discharge: 2020-03-13 | Disposition: A | Discharge status: Home / Self Care | Payer: Self-pay | Attending: Emergency Medicine | Admitting: Emergency Medicine

## 2020-03-12 ENCOUNTER — Emergency Department (HOSPITAL_COMMUNITY): Payer: Self-pay

## 2020-03-12 ENCOUNTER — Other Ambulatory Visit: Payer: Self-pay

## 2020-03-12 ENCOUNTER — Encounter (HOSPITAL_COMMUNITY): Payer: Self-pay | Admitting: Emergency Medicine

## 2020-03-12 DIAGNOSIS — Z794 Long term (current) use of insulin: Secondary | ICD-10-CM | POA: Insufficient documentation

## 2020-03-12 DIAGNOSIS — R0789 Other chest pain: Secondary | ICD-10-CM | POA: Insufficient documentation

## 2020-03-12 DIAGNOSIS — E119 Type 2 diabetes mellitus without complications: Secondary | ICD-10-CM | POA: Insufficient documentation

## 2020-03-12 DIAGNOSIS — Z87891 Personal history of nicotine dependence: Secondary | ICD-10-CM | POA: Insufficient documentation

## 2020-03-12 DIAGNOSIS — I10 Essential (primary) hypertension: Secondary | ICD-10-CM | POA: Insufficient documentation

## 2020-03-12 DIAGNOSIS — Z79899 Other long term (current) drug therapy: Secondary | ICD-10-CM | POA: Insufficient documentation

## 2020-03-12 HISTORY — DX: Pure hypercholesterolemia, unspecified: E78.00

## 2020-03-12 LAB — BASIC METABOLIC PANEL
Anion gap: 9 (ref 5–15)
BUN: 9 mg/dL (ref 6–20)
CO2: 24 mmol/L (ref 22–32)
Calcium: 9.1 mg/dL (ref 8.9–10.3)
Chloride: 105 mmol/L (ref 98–111)
Creatinine, Ser: 0.76 mg/dL (ref 0.44–1.00)
GFR calc Af Amer: >60 mL/min (ref >60)
GFR calc non Af Amer: >60 mL/min (ref >60)
Glucose, Bld: 179 mg/dL — ABNORMAL HIGH (ref 70–99)
Potassium: 3.9 mmol/L (ref 3.5–5.1)
Sodium: 138 mmol/L (ref 135–145)

## 2020-03-12 LAB — I-STAT BETA HCG BLOOD, ED (MC, WL, AP ONLY): I-stat hCG, quantitative: <5 m[IU]/mL (ref <5)

## 2020-03-12 LAB — CBC
HCT: 41.1 % (ref 36.0–46.0)
Hemoglobin: 13 g/dL (ref 12.0–15.0)
MCH: 28.1 pg (ref 26.0–34.0)
MCHC: 31.6 g/dL (ref 30.0–36.0)
MCV: 88.8 fL (ref 80.0–100.0)
Platelets: 271 10*3/uL (ref 150–400)
RBC: 4.63 MIL/uL (ref 3.87–5.11)
RDW: 13.1 % (ref 11.5–15.5)
WBC: 7.9 10*3/uL (ref 4.0–10.5)
nRBC: 0 % (ref 0.0–0.2)

## 2020-03-12 LAB — TROPONIN I (HIGH SENSITIVITY): Troponin I (High Sensitivity): 2 ng/L (ref <18)

## 2020-03-12 MED ORDER — SODIUM CHLORIDE 0.9% FLUSH
3.0000 mL | Freq: Once | INTRAVENOUS | Status: AC
  Administered 2020-03-13: 01:00:00 3 mL via INTRAVENOUS

## 2020-03-12 NOTE — ED Triage Notes (Addendum)
C/o intermittent L sided chest pain and L arm pain since this morning.  Denies nausea, vomiting, and SOB.  States she feels tired.  Denies pain at present.

## 2020-03-13 LAB — TROPONIN I (HIGH SENSITIVITY): Troponin I (High Sensitivity): 2 ng/L (ref <18)

## 2020-03-13 MED ORDER — KETOROLAC TROMETHAMINE 30 MG/ML IJ SOLN
30.0000 mg | Freq: Once | INTRAMUSCULAR | Status: AC
  Administered 2020-03-13: 30 mg via INTRAVENOUS
  Filled 2020-03-13: qty 1

## 2020-03-13 MED ORDER — IBUPROFEN 600 MG PO TABS: 600.0000 mg | ORAL_TABLET | Freq: Four times a day (QID) | ORAL | 0 refills | Status: DC | PRN

## 2020-03-13 MED FILL — IBUPROFEN 600 MG TABLET: 600 | 8 days supply | Qty: 30 | Fill #0

## 2020-03-13 NOTE — Discharge Instructions (Addendum)
Happy early birthday!  Thank you for allowing me to care for you today in the Emergency Department.   Take 600 mg of ibuprofen with food every 6 hours for the next 4 to 5 days to see if this will improve your symptoms.  If symptoms persist, please follow-up with your primary care provider.  Return to the emergency department if you develop chest pain that becomes persistent, that is accompanied by respiratory distress, new numbness or weakness, if your fingers or lips turn blue, if you pass out, or develop other new, concerning symptoms.
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
## 2020-03-13 NOTE — ED Notes (Signed)
Patient verbalizes understanding of discharge instructions. Opportunity for questioning and answers were provided. Armband removed by staff, pt discharged from ED ambulatory to home.  

## 2020-04-03 ENCOUNTER — Other Ambulatory Visit: Payer: Self-pay | Admitting: Family Medicine

## 2020-04-03 DIAGNOSIS — F411 Generalized anxiety disorder: Secondary | ICD-10-CM

## 2020-04-03 DIAGNOSIS — E1165 Type 2 diabetes mellitus with hyperglycemia: Secondary | ICD-10-CM

## 2020-04-03 DIAGNOSIS — I10 Essential (primary) hypertension: Secondary | ICD-10-CM

## 2020-05-17 ENCOUNTER — Other Ambulatory Visit: Payer: Self-pay | Admitting: Family Medicine

## 2020-05-17 DIAGNOSIS — F411 Generalized anxiety disorder: Secondary | ICD-10-CM

## 2020-05-17 MED FILL — ESCITALOPRAM 20 MG TABLET: 20 | 30 days supply | Qty: 30 | Fill #0

## 2020-05-17 MED FILL — busPIRone HCL 5 MG TABS: 5 | 30 days supply | Qty: 60 | Fill #0

## 2020-05-17 MED FILL — METOPROLOL SUCCINATE ER 200: 200 | 30 days supply | Qty: 30 | Fill #1

## 2020-05-19 MED FILL — LANTUS SOLOSTAR 100 UNITS/M: 100 | 30 days supply | Qty: 3 | Fill #7

## 2020-05-19 MED FILL — OMEPRAZOLE DR 20 MG CAPSULE: 20 | 30 days supply | Qty: 30 | Fill #0

## 2020-06-28 ENCOUNTER — Other Ambulatory Visit: Payer: Self-pay | Admitting: Family Medicine

## 2020-06-28 DIAGNOSIS — F411 Generalized anxiety disorder: Secondary | ICD-10-CM

## 2020-06-28 DIAGNOSIS — E1165 Type 2 diabetes mellitus with hyperglycemia: Secondary | ICD-10-CM

## 2020-06-28 MED FILL — METFORMIN HCL ER 500 MG TB2: 500 | 30 days supply | Qty: 120 | Fill #1

## 2020-06-28 MED FILL — DICLOFENAC SODIUM 1 % GEL: 1 | 25 days supply | Qty: 400 | Fill #2

## 2020-06-28 MED FILL — ATORVASTATIN 40 MG TABLET: 40 | 90 days supply | Qty: 90 | Fill #2

## 2020-06-28 MED FILL — METOPROLOL SUCCINATE ER 200: 200 | 30 days supply | Qty: 30 | Fill #2

## 2020-06-28 MED FILL — busPIRone HCL 5 MG TABS: 5 | 30 days supply | Qty: 60 | Fill #1

## 2020-06-28 MED FILL — OMEPRAZOLE DR 20 MG CAPSULE: 20 | 30 days supply | Qty: 30 | Fill #1

## 2020-06-29 MED FILL — ESCITALOPRAM 20 MG TABLET: 20 | 30 days supply | Qty: 30 | Fill #0

## 2020-06-29 NOTE — Telephone Encounter (Signed)
Attempted to call patient and there was no answer and no ability to leave a message.  .Shakeisha Horine R Nikoleta Dady, CMA  

## 2020-07-03 ENCOUNTER — Telehealth: Payer: Self-pay | Admitting: *Deleted

## 2020-07-03 DIAGNOSIS — E1165 Type 2 diabetes mellitus with hyperglycemia: Secondary | ICD-10-CM

## 2020-07-03 NOTE — Telephone Encounter (Signed)
Sent my chart message. Garin Mata Bruna Potter, CMA

## 2020-07-03 NOTE — Telephone Encounter (Signed)
Received fax from pharmacy, PA needed on Lantus.  See below for formulary options per covermymeds.      Cover My Meds info: Key: [id number]  Jone Baseman, CMA

## 2020-07-12 MED ORDER — BASAGLAR KWIKPEN 100 UNIT/ML ~~LOC~~ SOPN: 10.0000 [IU] | PEN_INJECTOR | SUBCUTANEOUS | 3 refills | Status: DC

## 2020-07-12 MED ORDER — BASAGLAR KWIKPEN 100 UNIT/ML ~~LOC~~ SOPN: 10.0000 [IU] | PEN_INJECTOR | SUBCUTANEOUS | 11 refills | Status: DC

## 2020-07-12 NOTE — Addendum Note (Signed)
Addended by: Steva Colder on: 07/12/2020 02:57 PM   Modules accepted: Orders

## 2020-07-12 NOTE — Telephone Encounter (Signed)
Preferred alternative of basaglar to pharmacy, 10 units.  Nursing please schedule for appointment with Dr. Dareen Piano for A1C check, last was July 2020.  Terisa Starr, MD  Family Medicine Teaching Service

## 2020-07-12 NOTE — Addendum Note (Signed)
Addended byManson Passey, Graiden Henes on: 07/12/2020 06:23 PM   Modules accepted: Orders

## 2020-07-12 NOTE — Telephone Encounter (Signed)
LMOVM for pt to return call.  Will inform of change and also make an appt.  Jone Baseman, CMA

## 2020-07-12 NOTE — Telephone Encounter (Signed)
90 day supply to [facility].  Please let patient know. Thank you,   Terisa Starr, MD  [facility] Medicine Teaching Service

## 2020-07-12 NOTE — Telephone Encounter (Signed)
Spoke with pharmacy and the Mariella Saa is covered, however needs to be send to mail order. Her insurance prefers [facility]. The prescription needs to be for a 3 month supply as well. Please advise.

## 2020-07-13 ENCOUNTER — Encounter: Payer: Self-pay | Admitting: *Deleted

## 2020-07-17 ENCOUNTER — Telehealth: Payer: Self-pay | Admitting: *Deleted

## 2020-07-17 MED ORDER — INSULIN GLARGINE 100 UNIT/ML SOLOSTAR PEN: 10.0000 [IU] | PEN_INJECTOR | SUBCUTANEOUS | 1 refills | Status: AC

## 2020-07-17 NOTE — Telephone Encounter (Signed)
Pharmacy had previously stated that basaglar IS covered and needed to be sent to mail-in pharmacy. A 90 day supply was sent. Now there is a message that it is not covered.  There seems to be some confusion with the pharmacy/insurance company.  Will send lantus solostar to try to help remedy.

## 2020-07-17 NOTE — Telephone Encounter (Signed)
Pharmacy states that basaglar Stephanie Coup not covered by insurance and wants alternative sent in. (lantus, lantus solostar, toujeo max solostar, Information systems manager). Please advise. Sadira Standard Bruna Potter, CMA

## 2020-07-27 ENCOUNTER — Other Ambulatory Visit (HOSPITAL_COMMUNITY): Payer: Self-pay | Admitting: Student in an Organized Health Care Education/Training Program

## 2020-07-27 MED ORDER — BASAGLAR KWIKPEN 100 UNIT/ML ~~LOC~~ SOPN: 10.0000 [IU] | PEN_INJECTOR | Freq: Every day | SUBCUTANEOUS | 0 refills | Status: DC

## 2020-09-01 ENCOUNTER — Other Ambulatory Visit: Payer: Self-pay

## 2020-09-01 DIAGNOSIS — F411 Generalized anxiety disorder: Secondary | ICD-10-CM

## 2020-09-01 MED ORDER — DICLOFENAC SODIUM 1 % EX GEL: 2.0000 g | Freq: Two times a day (BID) | CUTANEOUS | 0 refills | Status: AC | PRN

## 2020-09-01 MED ORDER — ESCITALOPRAM OXALATE 20 MG PO TABS: 20.0000 mg | ORAL_TABLET | Freq: Every day | ORAL | 0 refills | Status: AC

## 2020-09-01 MED ORDER — BASAGLAR KWIKPEN 100 UNIT/ML ~~LOC~~ SOPN: 10.0000 [IU] | PEN_INJECTOR | Freq: Every day | SUBCUTANEOUS | 0 refills | Status: DC

## 2020-09-01 MED ORDER — OMEPRAZOLE 20 MG PO CPDR: 20.0000 mg | DELAYED_RELEASE_CAPSULE | Freq: Every day | ORAL | 0 refills | Status: AC

## 2020-09-01 NOTE — Telephone Encounter (Signed)
Patient calls nurse line requesting refills on the following pended medications. Basaglar, omeprazole, voltaren gel, and lexapro.   Patient has had insurance change and is establishing care with new provider, however, appointment is not scheduled until 11/17. Patient reports that she is taking about 20 units of insulin daily and will need insulin sent to [facility].   To PCP  Please advise if patient can still receive refills.   Veronda Prude, RN

## 2020-09-05 ENCOUNTER — Telehealth: Payer: Self-pay | Admitting: *Deleted

## 2020-09-05 NOTE — Telephone Encounter (Signed)
Amy Osborne is not covered by insurance. Wants lantus solostar or toujeo solostar sent in as an alternative. Amy Osborne, CMA

## 2020-09-14 MED ORDER — INSULIN GLARGINE 100 UNIT/ML SOLOSTAR PEN: 10.0000 [IU] | PEN_INJECTOR | SUBCUTANEOUS | 11 refills | Status: AC

## 2020-09-14 NOTE — Addendum Note (Signed)
Addended by: Leeroy Bock on: 09/14/2020 11:12 AM   Modules accepted: Orders

## 2020-09-14 NOTE — Telephone Encounter (Signed)
Have refilled lantus solostar to patient's pharmacy for second time. She has endorsed that this is covered by her insurance.  I have requested her dose but patient did not respond. Prescribed for 10units.  Patient needs an in person appointment to discuss her diabetes and insulin use.

## 2021-06-13 IMAGING — CR DG CHEST 2V
2 series · 2 of 2 positions shown · non-contrast
Comparison: 05/14/2017

CLINICAL DATA: Chest pain

EXAM:
CHEST - 2 VIEW

[chest pa]
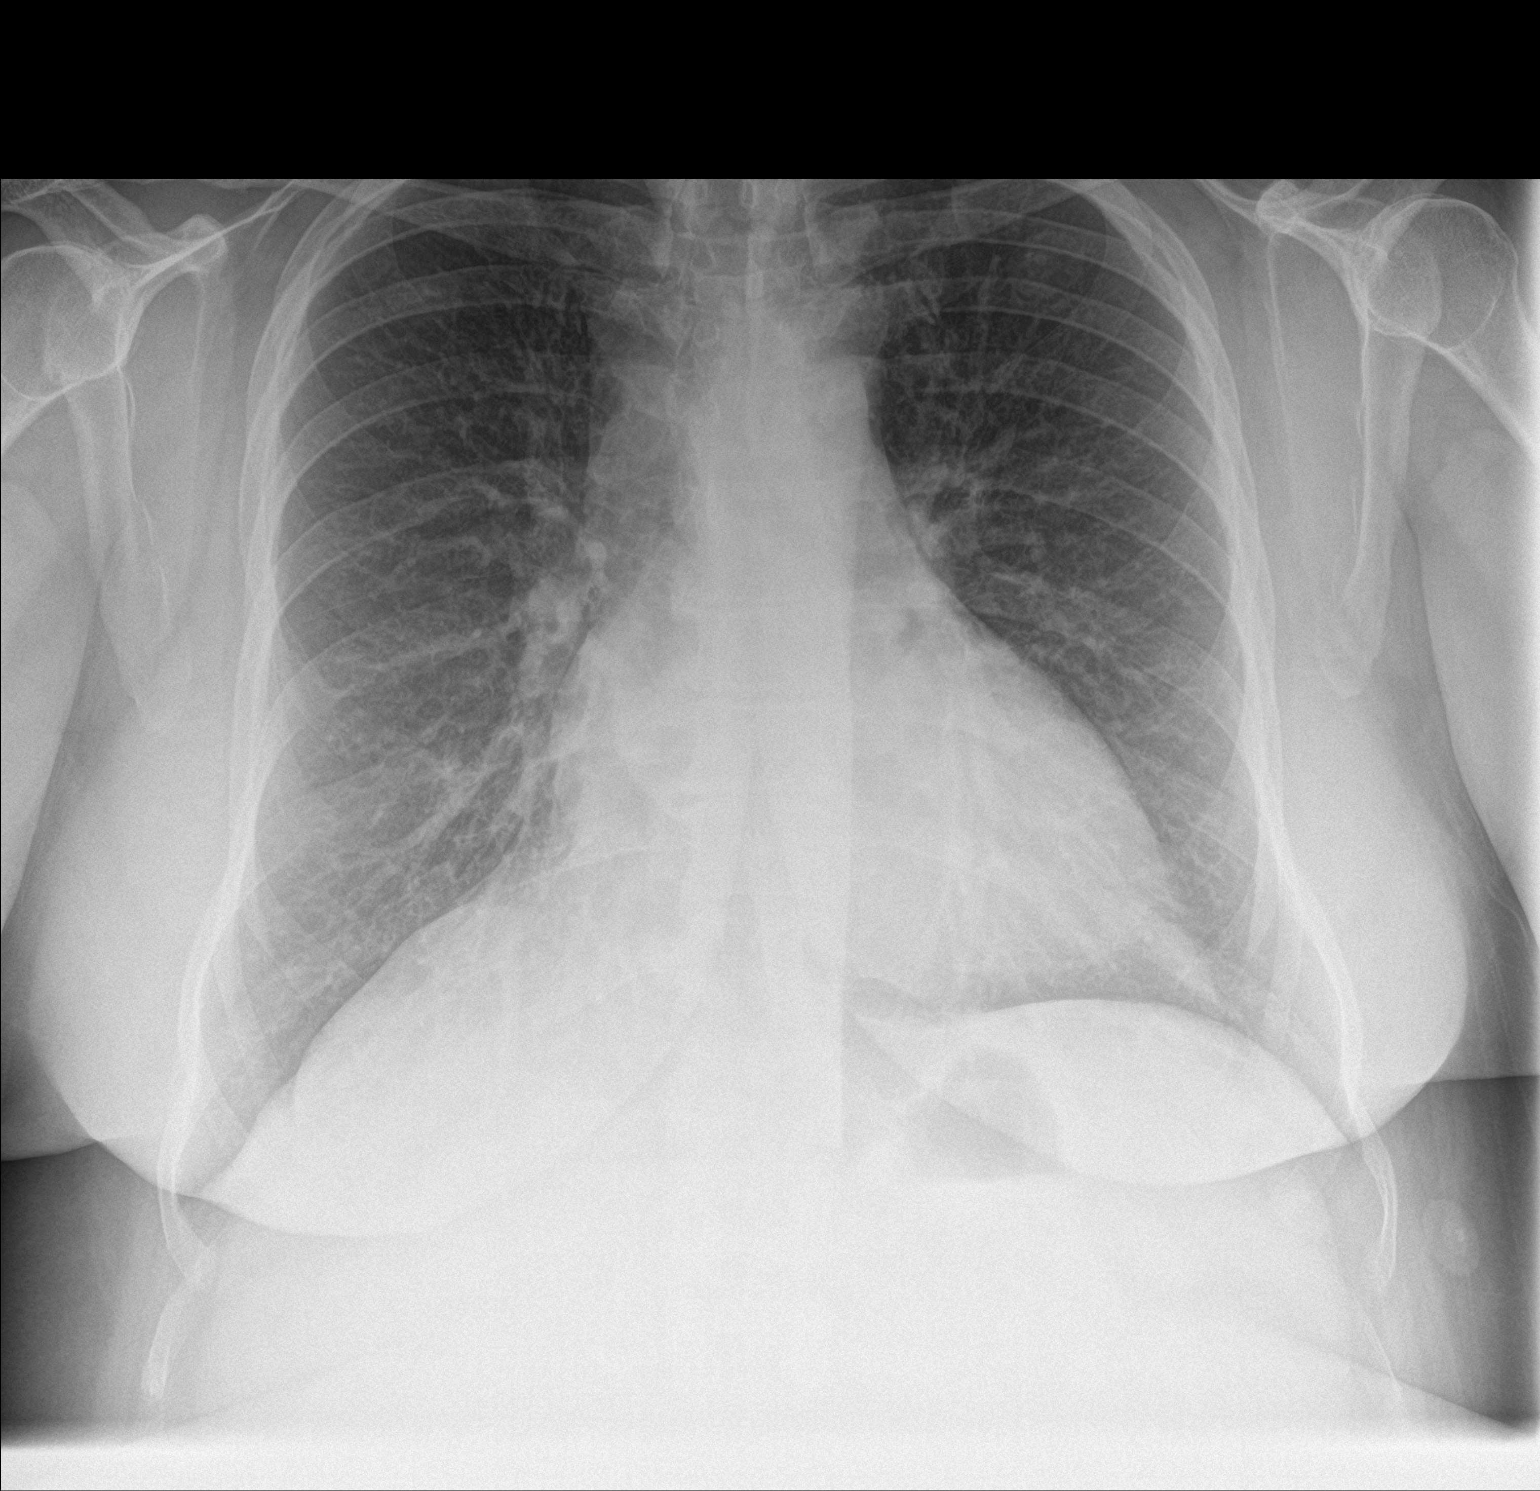

[chest lat]
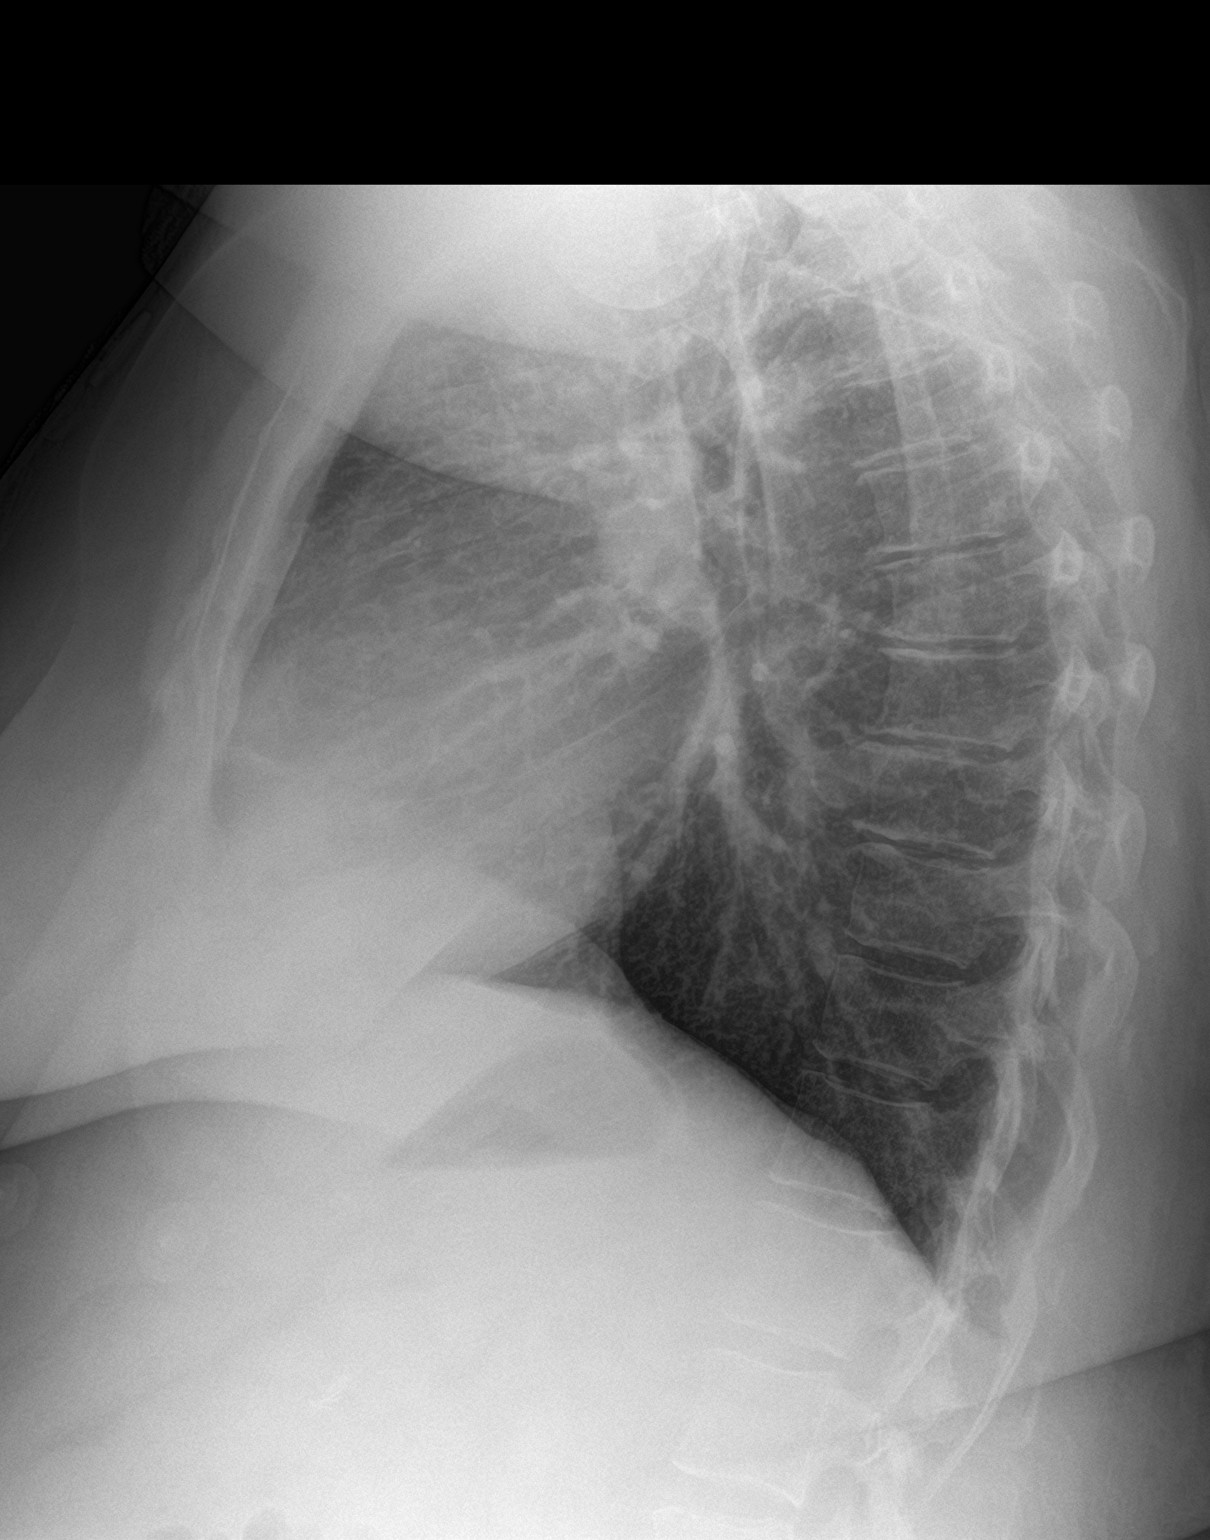

[2 of 2 positions shown; findings below may reference images not displayed]

FINDINGS: The heart size and mediastinal contours are within normal limits.
Both lungs are clear. The visualized skeletal structures are
unremarkable.
IMPRESSION: No active cardiopulmonary disease.
